# Patient Record
Sex: Female | Born: 1975
Health system: Southern US, Community
[De-identification: ages and names within clinical notes are randomized; demographics above are authoritative.]

## PROBLEM LIST (undated history)

## (undated) DIAGNOSIS — Z923 Personal history of irradiation: Secondary | ICD-10-CM

## (undated) DIAGNOSIS — G43909 Migraine, unspecified, not intractable, without status migrainosus: Secondary | ICD-10-CM

## (undated) DIAGNOSIS — K2970 Gastritis, unspecified, without bleeding: Secondary | ICD-10-CM

## (undated) DIAGNOSIS — K219 Gastro-esophageal reflux disease without esophagitis: Secondary | ICD-10-CM

## (undated) DIAGNOSIS — J45909 Unspecified asthma, uncomplicated: Secondary | ICD-10-CM

## (undated) HISTORY — PX: BREAST LUMPECTOMY: SHX2

## (undated) HISTORY — PX: PARTIAL THYMECTOMY: SHX2177

## (undated) HISTORY — PX: THYROIDECTOMY: SHX17

---

## 2001-05-24 ENCOUNTER — Encounter: Admission: RE | Admit: 2001-05-24 | Discharge: 2001-05-24 | Payer: Self-pay | Admitting: Neurosurgery

## 2001-05-24 ENCOUNTER — Encounter: Payer: Self-pay | Admitting: Neurosurgery

## 2004-04-23 ENCOUNTER — Emergency Department: Payer: Self-pay | Admitting: General Practice

## 2004-07-17 ENCOUNTER — Emergency Department: Payer: Self-pay | Admitting: Emergency Medicine

## 2005-02-16 ENCOUNTER — Observation Stay: Payer: Self-pay | Admitting: Obstetrics and Gynecology

## 2005-02-28 ENCOUNTER — Observation Stay: Payer: Self-pay | Admitting: Obstetrics and Gynecology

## 2005-03-06 ENCOUNTER — Inpatient Hospital Stay: Payer: Self-pay | Admitting: Obstetrics and Gynecology

## 2007-09-30 ENCOUNTER — Encounter: Payer: Self-pay | Admitting: Maternal and Fetal Medicine

## 2007-11-07 ENCOUNTER — Ambulatory Visit: Payer: Self-pay | Admitting: Obstetrics and Gynecology

## 2008-01-13 ENCOUNTER — Encounter: Payer: Self-pay | Admitting: Obstetrics & Gynecology

## 2008-01-14 ENCOUNTER — Encounter: Payer: Self-pay | Admitting: Obstetrics & Gynecology

## 2008-02-05 ENCOUNTER — Observation Stay: Payer: Self-pay | Admitting: Obstetrics and Gynecology

## 2008-02-10 ENCOUNTER — Encounter: Payer: Self-pay | Admitting: Obstetrics and Gynecology

## 2008-02-21 ENCOUNTER — Observation Stay: Payer: Self-pay | Admitting: Obstetrics and Gynecology

## 2008-02-25 ENCOUNTER — Observation Stay: Payer: Self-pay | Admitting: Obstetrics and Gynecology

## 2008-03-04 ENCOUNTER — Observation Stay: Payer: Self-pay | Admitting: Obstetrics and Gynecology

## 2008-03-09 ENCOUNTER — Observation Stay: Payer: Self-pay | Admitting: Obstetrics and Gynecology

## 2008-03-12 ENCOUNTER — Observation Stay: Payer: Self-pay | Admitting: Obstetrics and Gynecology

## 2008-03-16 ENCOUNTER — Encounter: Payer: Self-pay | Admitting: Maternal & Fetal Medicine

## 2008-03-23 ENCOUNTER — Observation Stay: Payer: Self-pay | Admitting: Obstetrics and Gynecology

## 2008-03-26 ENCOUNTER — Ambulatory Visit: Payer: Self-pay | Admitting: Obstetrics and Gynecology

## 2008-03-27 ENCOUNTER — Inpatient Hospital Stay: Payer: Self-pay | Admitting: Obstetrics and Gynecology

## 2009-12-02 ENCOUNTER — Ambulatory Visit: Payer: Self-pay | Admitting: Allergy

## 2010-03-10 ENCOUNTER — Ambulatory Visit: Payer: Self-pay | Admitting: Otolaryngology

## 2010-03-17 ENCOUNTER — Ambulatory Visit: Payer: Self-pay | Admitting: Otolaryngology

## 2010-03-28 LAB — PATHOLOGY REPORT

## 2010-06-14 ENCOUNTER — Ambulatory Visit: Payer: Self-pay | Admitting: Neurology

## 2010-06-15 ENCOUNTER — Ambulatory Visit: Payer: Self-pay | Admitting: Neurology

## 2011-03-16 DIAGNOSIS — E079 Disorder of thyroid, unspecified: Secondary | ICD-10-CM | POA: Insufficient documentation

## 2012-10-05 ENCOUNTER — Emergency Department: Payer: Self-pay | Admitting: Emergency Medicine

## 2012-10-16 ENCOUNTER — Emergency Department: Payer: Self-pay | Admitting: Internal Medicine

## 2012-11-22 ENCOUNTER — Emergency Department: Payer: Self-pay | Admitting: Emergency Medicine

## 2013-03-26 ENCOUNTER — Emergency Department: Payer: Self-pay | Admitting: Emergency Medicine

## 2013-03-26 LAB — CBC
HCT: 39 % (ref 35.0–47.0)
HGB: 12.7 g/dL (ref 12.0–16.0)
MCH: 27 pg (ref 26.0–34.0)
MCHC: 32.6 g/dL (ref 32.0–36.0)
MCV: 83 fL (ref 80–100)
Platelet: 270 10*3/uL (ref 150–440)
RBC: 4.71 10*6/uL (ref 3.80–5.20)
RDW: 13.4 % (ref 11.5–14.5)
WBC: 10.2 10*3/uL (ref 3.6–11.0)

## 2013-03-26 LAB — COMPREHENSIVE METABOLIC PANEL
Albumin: 3.5 g/dL (ref 3.4–5.0)
Alkaline Phosphatase: 79 U/L
Anion Gap: 8 (ref 7–16)
BUN: 12 mg/dL (ref 7–18)
Bilirubin,Total: 0.5 mg/dL (ref 0.2–1.0)
Calcium, Total: 8.6 mg/dL (ref 8.5–10.1)
Chloride: 104 mmol/L (ref 98–107)
Co2: 27 mmol/L (ref 21–32)
Creatinine: 0.9 mg/dL (ref 0.60–1.30)
EGFR (African American): >60
EGFR (Non-African Amer.): >60
Glucose: 110 mg/dL — ABNORMAL HIGH (ref 65–99)
Osmolality: 278 (ref 275–301)
Potassium: 3.5 mmol/L (ref 3.5–5.1)
SGOT(AST): 16 U/L (ref 15–37)
SGPT (ALT): 28 U/L (ref 12–78)
Sodium: 139 mmol/L (ref 136–145)
Total Protein: 7.4 g/dL (ref 6.4–8.2)

## 2013-03-26 LAB — URINALYSIS, COMPLETE
Bilirubin,UR: NEGATIVE
Blood: NEGATIVE
Glucose,UR: NEGATIVE mg/dL (ref 0–75)
Ketone: NEGATIVE
Leukocyte Esterase: NEGATIVE
Nitrite: NEGATIVE
Ph: 5 (ref 4.5–8.0)
Protein: 30
RBC,UR: 1 /HPF (ref 0–5)
Specific Gravity: 1.021 (ref 1.003–1.030)
Squamous Epithelial: 3
WBC UR: 5 /HPF (ref 0–5)

## 2013-03-26 LAB — PREGNANCY, URINE: Pregnancy Test, Urine: NEGATIVE m[IU]/mL

## 2013-03-28 ENCOUNTER — Ambulatory Visit: Payer: Self-pay | Admitting: Gastroenterology

## 2013-04-01 LAB — PATHOLOGY REPORT

## 2013-09-15 ENCOUNTER — Encounter (HOSPITAL_COMMUNITY): Payer: Self-pay | Admitting: Emergency Medicine

## 2013-09-15 ENCOUNTER — Emergency Department (HOSPITAL_COMMUNITY)
Admission: EM | Admit: 2013-09-15 | Discharge: 2013-09-15 | Disposition: A | Discharge status: Home / Self Care | Payer: 59 | Attending: Emergency Medicine | Admitting: Emergency Medicine

## 2013-09-15 ENCOUNTER — Emergency Department (HOSPITAL_COMMUNITY): Payer: 59

## 2013-09-15 DIAGNOSIS — K219 Gastro-esophageal reflux disease without esophagitis: Secondary | ICD-10-CM | POA: Diagnosis not present

## 2013-09-15 DIAGNOSIS — Z79899 Other long term (current) drug therapy: Secondary | ICD-10-CM | POA: Insufficient documentation

## 2013-09-15 DIAGNOSIS — Z91040 Latex allergy status: Secondary | ICD-10-CM | POA: Diagnosis not present

## 2013-09-15 DIAGNOSIS — R1310 Dysphagia, unspecified: Secondary | ICD-10-CM | POA: Insufficient documentation

## 2013-09-15 DIAGNOSIS — R4702 Dysphasia: Secondary | ICD-10-CM

## 2013-09-15 DIAGNOSIS — J45909 Unspecified asthma, uncomplicated: Secondary | ICD-10-CM | POA: Diagnosis not present

## 2013-09-15 DIAGNOSIS — G43909 Migraine, unspecified, not intractable, without status migrainosus: Secondary | ICD-10-CM | POA: Insufficient documentation

## 2013-09-15 DIAGNOSIS — K2970 Gastritis, unspecified, without bleeding: Secondary | ICD-10-CM | POA: Diagnosis not present

## 2013-09-15 DIAGNOSIS — Z9889 Other specified postprocedural states: Secondary | ICD-10-CM | POA: Insufficient documentation

## 2013-09-15 DIAGNOSIS — K2990 Gastroduodenitis, unspecified, without bleeding: Secondary | ICD-10-CM

## 2013-09-15 HISTORY — DX: Migraine, unspecified, not intractable, without status migrainosus: G43.909

## 2013-09-15 HISTORY — DX: Gastro-esophageal reflux disease without esophagitis: K21.9

## 2013-09-15 HISTORY — DX: Gastritis, unspecified, without bleeding: K29.70

## 2013-09-15 HISTORY — DX: Unspecified asthma, uncomplicated: J45.909

## 2013-09-15 MED ORDER — IBUPROFEN 100 MG/5ML PO SUSP
600.0000 mg | Freq: Once | ORAL | Status: AC
  Administered 2013-09-15: 600 mg via ORAL
  Filled 2013-09-15: qty 30

## 2013-09-15 NOTE — Discharge Instructions (Signed)
Your ultrasound is normal  You r left thyroid lobe is normal and there has been no regrowth of the right lobe.  You do have a slightly enlarged lymph node

## 2013-09-15 NOTE — ED Provider Notes (Signed)
CSN: [id number]     Arrival date & time 09/15/13  1809 History   First MD Initiated Contact with Patient 09/15/13 2043     Chief Complaint  Patient presents with  . Dysphagia     (Consider location/radiation/quality/duration/timing/severity/associated sxs/prior Treatment) HPI Comments: Patient reports dysphagia for several months was seen at [facility] and sent to cardiology--negative workup, than to GI who preformed an upper endoscopy which revealed gastric ulcers and esophagitis given Carafate which was helping but still feels as thought there is something in her throat that causes discomfort with swallowing.  She does have a history of a beginin thyroid nodule that was removed in 2011 or 2013   Has a appointment with ENT in 2-3 weeks but can't wait that long/  The history is provided by the patient.    Past Medical History  Diagnosis Date  . Gastritis   . GERD (gastroesophageal reflux disease)   . Asthma   . Migraine    Past Surgical History  Procedure Laterality Date  . Partial thymectomy    . Cesarean section     No family history on file. History  Substance Use Topics  . Smoking status: Never Smoker   . Smokeless tobacco: Not on file  . Alcohol Use: No   OB History   Grav Para Term Preterm Abortions TAB SAB Ect Mult Living                 Review of Systems  Constitutional: Negative for fever, activity change and appetite change.  HENT: Positive for trouble swallowing. Negative for dental problem, drooling and sore throat.   Skin: Negative for rash and wound.  Neurological: Negative for dizziness and weakness.  All other systems reviewed and are negative.     Allergies  Latex  Home Medications   Prior to Admission medications   Medication Sig Start Date End Date Taking? Authorizing Provider  ALPRAZolam Duanne Moron) 1 MG tablet Take 1 mg by mouth 2 (two) times daily as needed for anxiety.   Yes Historical Provider, MD  buPROPion (WELLBUTRIN XL) 300 MG 24  hr tablet Take 300 mg by mouth daily.   Yes Historical Provider, MD  eletriptan (RELPAX) 40 MG tablet Take 40 mg by mouth as needed for migraine or headache. One tablet by mouth at onset of headache. May repeat in 2 hours if headache persists or recurs.   Yes Historical Provider, MD  omeprazole (PRILOSEC) 20 MG capsule Take 20 mg by mouth 2 (two) times daily.   Yes Historical Provider, MD  promethazine (PHENERGAN) 12.5 MG tablet Take 12.5 mg by mouth every 6 (six) hours as needed for nausea or vomiting.   Yes Historical Provider, MD   BP 106/64  Pulse 96  Temp(Src) 98.6 F (37 C) (Oral)  Resp 23  SpO2 98%  LMP 09/08/2013 Physical Exam  Nursing note and vitals reviewed. Constitutional: She appears well-developed and well-nourished.  HENT:  Head: Normocephalic.  Eyes: Pupils are equal, round, and reactive to light.  Neck: Normal range of motion. No tracheal tenderness present. No tracheal deviation present. No thyromegaly present.      ED Course  Procedures (including critical care time) Labs Review Labs Reviewed - No data to display  Imaging Review US Soft Tissue Head/neck  09/15/2013   CLINICAL DATA:  Right neck nodule. Dysphagia. Right thyroidectomy 2011  EXAM: THYROID ULTRASOUND  TECHNIQUE: Ultrasound examination of the thyroid gland and adjacent soft tissues was performed.  COMPARISON:  None.  FINDINGS:  Right thyroid lobe  Surgically absent  Left thyroid lobe  Measurements: 4.2 x 1.6 x 1.5 cm.  No nodules visualized.  Isthmus  Thickness: 5 mm.  No nodules visualized.  Lymphadenopathy  None visualized.  IMPRESSION: Negative for mass in the neck post right thyroidectomy. Left thyroid lobe is normal.   Electronically Signed   By: Franchot Gallo M.D.   On: 09/15/2013 23:31     EKG Interpretation None      MDM  Ultrasound does not reveal the cause of this patients dysphagia but she has been reasured that there has ben no regrowth of her tumor Final diagnoses:  Dysphasia          Garald Balding, NP 09/15/13 2344

## 2013-09-15 NOTE — ED Notes (Addendum)
Pt presents for evaluation of difficulty swallowing for the past several months, reports for the last 3 weeks the feeling has gotten worse.  Pt was seen at [facility] ED 2 months ago and was dx with gastric ulcers and inflammation of esophagus-  given Carafate which she reports has been helping some but "still feels like something is stuck in my throat".  Pt has had partial thyroidectomy to the right side in the past and states "something doesn't feel right".  Pt speaking in full sentences in triage without difficulty, no shortness of breath or respiratory distress noted.

## 2013-09-16 NOTE — ED Provider Notes (Signed)
Medical screening examination/treatment/procedure(s) were performed by non-physician practitioner and as supervising physician I was immediately available for consultation/collaboration.   EKG Interpretation None       Ezequiel Essex, MD 09/16/13 1007

## 2013-09-26 ENCOUNTER — Ambulatory Visit: Payer: Self-pay | Admitting: Gastroenterology

## 2013-10-02 ENCOUNTER — Ambulatory Visit: Payer: Self-pay | Admitting: Gastroenterology

## 2013-10-28 DIAGNOSIS — R1310 Dysphagia, unspecified: Secondary | ICD-10-CM | POA: Insufficient documentation

## 2013-10-28 DIAGNOSIS — R1319 Other dysphagia: Secondary | ICD-10-CM | POA: Insufficient documentation

## 2013-10-28 DIAGNOSIS — K5909 Other constipation: Secondary | ICD-10-CM | POA: Insufficient documentation

## 2013-10-29 ENCOUNTER — Ambulatory Visit: Payer: Self-pay | Admitting: Gastroenterology

## 2013-11-11 ENCOUNTER — Encounter: Payer: Self-pay | Admitting: Gastroenterology

## 2013-11-25 ENCOUNTER — Ambulatory Visit: Payer: Self-pay | Admitting: Gastroenterology

## 2013-11-28 DIAGNOSIS — R0602 Shortness of breath: Secondary | ICD-10-CM | POA: Insufficient documentation

## 2013-11-28 DIAGNOSIS — R079 Chest pain, unspecified: Secondary | ICD-10-CM | POA: Insufficient documentation

## 2014-04-08 ENCOUNTER — Emergency Department: Payer: Self-pay | Admitting: Student

## 2014-05-01 ENCOUNTER — Ambulatory Visit: Payer: Self-pay | Admitting: Neurology

## 2015-10-04 ENCOUNTER — Other Ambulatory Visit: Payer: Self-pay | Admitting: Family Medicine

## 2015-10-04 ENCOUNTER — Ambulatory Visit
Admission: RE | Admit: 2015-10-04 | Discharge: 2015-10-04 | Disposition: A | Discharge status: Home / Self Care | Payer: 59 | Source: Ambulatory Visit | Attending: Family Medicine | Admitting: Family Medicine

## 2015-10-04 DIAGNOSIS — R109 Unspecified abdominal pain: Secondary | ICD-10-CM

## 2015-10-08 ENCOUNTER — Encounter: Payer: Self-pay | Admitting: Urology

## 2015-10-08 ENCOUNTER — Ambulatory Visit (INDEPENDENT_AMBULATORY_CARE_PROVIDER_SITE_OTHER): Payer: 59 | Admitting: Urology

## 2015-10-08 VITALS — BP 100/72 | HR 111 | Ht 63.0 in | Wt 231.8 lb

## 2015-10-08 DIAGNOSIS — R300 Dysuria: Secondary | ICD-10-CM

## 2015-10-08 DIAGNOSIS — N3020 Other chronic cystitis without hematuria: Secondary | ICD-10-CM | POA: Diagnosis not present

## 2015-10-08 DIAGNOSIS — F32A Depression, unspecified: Secondary | ICD-10-CM | POA: Insufficient documentation

## 2015-10-08 DIAGNOSIS — E669 Obesity, unspecified: Secondary | ICD-10-CM | POA: Insufficient documentation

## 2015-10-08 DIAGNOSIS — G43909 Migraine, unspecified, not intractable, without status migrainosus: Secondary | ICD-10-CM | POA: Insufficient documentation

## 2015-10-08 DIAGNOSIS — R3129 Other microscopic hematuria: Secondary | ICD-10-CM

## 2015-10-08 DIAGNOSIS — J45909 Unspecified asthma, uncomplicated: Secondary | ICD-10-CM | POA: Insufficient documentation

## 2015-10-08 DIAGNOSIS — E063 Autoimmune thyroiditis: Secondary | ICD-10-CM | POA: Insufficient documentation

## 2015-10-08 DIAGNOSIS — F419 Anxiety disorder, unspecified: Secondary | ICD-10-CM

## 2015-10-08 DIAGNOSIS — B019 Varicella without complication: Secondary | ICD-10-CM | POA: Insufficient documentation

## 2015-10-08 DIAGNOSIS — F329 Major depressive disorder, single episode, unspecified: Secondary | ICD-10-CM | POA: Insufficient documentation

## 2015-10-08 LAB — MICROSCOPIC EXAMINATION: WBC UA: NONE SEEN /HPF (ref 0–?)

## 2015-10-08 LAB — URINALYSIS, COMPLETE
BILIRUBIN UA: NEGATIVE
Glucose, UA: NEGATIVE
Ketones, UA: NEGATIVE
LEUKOCYTES UA: NEGATIVE
NITRITE UA: NEGATIVE
PH UA: 5.5 (ref 5.0–7.5)
Protein, UA: NEGATIVE
Specific Gravity, UA: <1.005 — ABNORMAL LOW (ref 1.005–1.030)
Urobilinogen, Ur: 0.2 mg/dL (ref 0.2–1.0)

## 2015-10-08 MED ORDER — TRIMETHOPRIM 100 MG PO TABS: 100.0000 mg | ORAL_TABLET | Freq: Every day | ORAL | 3 refills | Status: DC

## 2015-10-08 MED ORDER — TRIMETHOPRIM 100 MG PO TABS: 100.0000 mg | ORAL_TABLET | Freq: Every day | ORAL | 3 refills | Status: AC

## 2015-10-08 NOTE — Progress Notes (Signed)
10/08/2015 11:25 AM   Melissa Williamson 1975-11-11 [id number]  Referring provider: Briscoe Deutscher, DO [facility] [place], [place] [address]  Chief Complaint  Patient presents with  . Dysuria    New Patient   patient HPI: The patient describes recurrent urinary tract infections with burning, burning after urination, frequency and foul-smelling and cloudy urine that usually temporarily respond to antibiotic. She has them monthly  At baseline She voids every 1-2 hours and gets up once at night. He voids because of bladder fullness and generally feels empty and reports a good flow  He has pain with intercourse if she is having an infection.  She denies a history of kidney stones previous GU surgery and has not had a hysterectomy. She has no neurologic issues.  She recently went to the hospital and had a negative CT scan and negative culture. Her gynecologist does not send cultures based upon the history  Today she had microscopic hematuria on urinalysis  Modifying factors: There are no other modifying factors  Associated signs and symptoms: There are no other associated signs and symptoms Aggravating and relieving factors: There are no other aggravating or relieving factors Severity: Moderate Duration: Persistent       Past Medical History:  Diagnosis Date  . Asthma   . Gastritis   . GERD (gastroesophageal reflux disease)   . Migraine     Surgical History: Past Surgical History:  Procedure Laterality Date  . CESAREAN SECTION    . PARTIAL THYMECTOMY    . THYROIDECTOMY      Home Medications:    Medication List       Accurate as of 10/08/15 11:25 AM. Always use your most recent med list.          ALPRAZolam 1 MG tablet Commonly known as:  XANAX Take 1 mg by mouth 2 (two) times daily as needed for anxiety.   buPROPion 300 MG 24 hr tablet Commonly known as:  WELLBUTRIN XL Take 300 mg by mouth daily.   eletriptan 40 MG  tablet Commonly known as:  RELPAX Take 40 mg by mouth as needed for migraine or headache. One tablet by mouth at onset of headache. May repeat in 2 hours if headache persists or recurs.   omeprazole 20 MG capsule Commonly known as:  PRILOSEC Take 20 mg by mouth 2 (two) times daily.   promethazine 12.5 MG tablet Commonly known as:  PHENERGAN Take 12.5 mg by mouth every 6 (six) hours as needed for nausea or vomiting.   URO-MP 118 MG Caps Take by mouth 4 (four) times daily as needed.       Allergies:  Allergies  Allergen Reactions  . Codeine     Other reaction(s): Vomiting  . Latex     Other reaction(s): Other (See Comments) Rash  Rash     Family History: Family History  Problem Relation Age of Onset  . Bladder Cancer Maternal Grandmother     Social History:  reports that she has never smoked. She does not have any smokeless tobacco history on file. She reports that she does not drink alcohol. Her drug history is not on file.  ROS: UROLOGY Frequent Urination?: Yes Hard to postpone urination?: No Burning/pain with urination?: Yes Get up at night to urinate?: Yes Leakage of urine?: Yes Urine stream starts and stops?: No Trouble starting stream?: No Do you have to strain to urinate?: No Blood in urine?: Yes Urinary tract infection?: Yes Sexually  transmitted disease?: No Injury to kidneys or bladder?: No Painful intercourse?: No Weak stream?: No Currently pregnant?: No Vaginal bleeding?: No Last menstrual period?: No  Gastrointestinal Nausea?: No Vomiting?: No Indigestion/heartburn?: Yes Diarrhea?: No Constipation?: No  Constitutional Fever: No Night sweats?: No Weight loss?: No Fatigue?: Yes  Skin Skin rash/lesions?: No Itching?: No  Eyes Blurred vision?: No Double vision?: No  Ears/Nose/Throat Sore throat?: No Sinus problems?: No  Hematologic/Lymphatic Swollen glands?: No Easy bruising?: No  Cardiovascular Leg swelling?: No Chest  pain?: No  Respiratory Cough?: No Shortness of breath?: No  Endocrine Excessive thirst?: No  Musculoskeletal Back pain?: No Joint pain?: No  Neurological Headaches?: Yes Dizziness?: No  Psychologic Depression?: No Anxiety?: No  Physical Exam: BP 100/72   Pulse (!) 111   Ht 5\' 3"  (1.6 m)   Wt 231 lb 12.8 oz (105.1 kg)   BMI 41.06 kg/m   Constitutional:  Alert and oriented, No acute distress. HEENT: [place] AT, moist mucus membranes.  Trachea midline, no masses. Cardiovascular: No clubbing, cyanosis, or edema. Respiratory: Normal respiratory effort, no increased work of breathing. GI: Abdomen is soft, nontender, nondistended, no abdominal masses GU: Small grade 2 cystocele and a negative cough test Skin: No rashes, bruises or suspicious lesions. Lymph: No cervical or inguinal adenopathy. Neurologic: Grossly intact, no focal deficits, moving all 4 extremities. Psychiatric: Normal mood and affect.  Laboratory Data: Lab Results  Component Value Date   WBC 10.2 03/26/2013   HGB 12.7 03/26/2013   HCT 39.0 03/26/2013   MCV 83 03/26/2013   PLT 270 03/26/2013    Lab Results  Component Value Date   CREATININE 0.90 03/26/2013    No results found for: PSA  No results found for: TESTOSTERONE  No results found for: HGBA1C  Urinalysis    Component Value Date/Time   COLORURINE Yellow 03/26/2013 0158   APPEARANCEUR Hazy 03/26/2013 0158   LABSPEC 1.021 03/26/2013 0158   PHURINE 5.0 03/26/2013 0158   GLUCOSEU Negative 03/26/2013 0158   HGBUR Negative 03/26/2013 0158   BILIRUBINUR Negative 03/26/2013 0158   KETONESUR Negative 03/26/2013 0158   PROTEINUR 30 mg/dL 03/26/2013 0158   NITRITE Negative 03/26/2013 0158   LEUKOCYTESUR Negative 03/26/2013 0158    Pertinent Imaging: Normal CT scan  Assessment & Plan:  the patient likely has chronic cystitis. My index of suspicion is low that she has interstitial cystitis. Someone in the emergency room actually place her on  Elmiron. She never filled the prescription Over the counter medications ashy help some of her symptoms.  At baseline she has mild nocturia and moderate frequency.  I have recommended a cystoscopy for the microscopic hematuria. The pathophysiology of chronic cystitis was discussed. The role of prophylaxis was discussed. The role of getting cultures was discussed. If she has flares with negative cultures I would recommend a bladder hydrodistention  I will see the patient in 2 months on daily trimethoprim. 90 tablets and 3 refills sent. I will perform cystoscopy on that day   1. Dysuria 2. Chronic cystitis 3. Microscopic hematuria    Urinalysis, Complete   No Follow-up on file.  Reece Packer, MD  [facility] [address] [place], [place] [address] [phone]

## 2015-10-26 ENCOUNTER — Ambulatory Visit: Payer: Self-pay | Admitting: Urology

## 2015-12-27 ENCOUNTER — Ambulatory Visit (INDEPENDENT_AMBULATORY_CARE_PROVIDER_SITE_OTHER): Payer: 59 | Admitting: Urology

## 2015-12-27 VITALS — BP 107/76 | HR 106 | Ht 63.0 in | Wt 231.6 lb

## 2015-12-27 DIAGNOSIS — N3020 Other chronic cystitis without hematuria: Secondary | ICD-10-CM | POA: Diagnosis not present

## 2015-12-27 DIAGNOSIS — R300 Dysuria: Secondary | ICD-10-CM

## 2015-12-27 MED ORDER — LIDOCAINE HCL 2 % EX GEL
1.0000 "application " | Freq: Once | CUTANEOUS | Status: AC
  Administered 2015-12-27: 1 via URETHRAL

## 2015-12-27 MED ORDER — CIPROFLOXACIN HCL 500 MG PO TABS
500.0000 mg | ORAL_TABLET | Freq: Once | ORAL | Status: AC
  Administered 2015-12-27: 500 mg via ORAL

## 2015-12-27 MED ORDER — CIPROFLOXACIN HCL 500 MG PO TABS: 500.0000 mg | ORAL_TABLET | Freq: Once | ORAL | Status: DC

## 2015-12-27 MED ORDER — LIDOCAINE HCL 2 % EX GEL
1.0000 | Freq: Once | CUTANEOUS | Status: DC
Start: 2015-12-27 — End: 2015-12-27

## 2015-12-27 NOTE — Progress Notes (Signed)
12/27/2015 4:16 PM   Melissa Williamson September 21, 1975 [id number]  Referring provider: No referring provider defined for this encounter.  Chief Complaint  Patient presents with  . Cysto    dysuria    HPI: The patient describes recurrent urinary tract infections with burning, burning after urination, frequency and foul-smelling and cloudy urine that usually temporarily respond to antibiotic. She has them monthly  At baseline She voids every 1-2 hours and gets up once at night. He voids because of bladder fullness and generally feels empty and reports a good flow  He has pain with intercourse if she is having an infection.  She recently went to the hospital and had a negative CT scan and negative culture. Her gynecologist does not send cultures based upon the history    Small grade 2 cystocele and a negative cough test  the patient likely has chronic cystitis. My index of suspicion is low that she has interstitial cystitis. Someone in the emergency room actually place her on Elmiron. She never filled the prescription Over the counter medications ashy help some of her symptoms.  At baseline she has mild nocturia and moderate frequency.  I have recommended a cystoscopy for the microscopic hematuria. The pathophysiology of chronic cystitis was discussed. The role of prophylaxis was discussed. The role of getting cultures was discussed. If she has flares with negative cultures I would recommend a bladder hydrodistention  Today Frequency is stable It took a while but now on daily trimethoprim she is doing well. Clinically she is not infected today  Cystoscopy Flexible cystoscopy utilizing sterile technique after consent was performed. It took a few moments to fill her bladder. Overall she tolerated the procedure very well. The bladder mucosa and trigone were normal. There was no stitch or foreign body or carcinoma. There was no cystitis.     PMH: Past Medical History:  Diagnosis  Date  . Asthma   . Gastritis   . GERD (gastroesophageal reflux disease)   . Migraine     Surgical History: Past Surgical History:  Procedure Laterality Date  . CESAREAN SECTION    . PARTIAL THYMECTOMY    . THYROIDECTOMY      Home Medications:    Medication List       Accurate as of 12/27/15  4:16 PM. Always use your most recent med list.          ALPRAZolam 1 MG tablet Commonly known as:  XANAX Take 1 mg by mouth 2 (two) times daily as needed for anxiety.   buPROPion 300 MG 24 hr tablet Commonly known as:  WELLBUTRIN XL Take 300 mg by mouth daily.   eletriptan 40 MG tablet Commonly known as:  RELPAX Take 40 mg by mouth as needed for migraine or headache. One tablet by mouth at onset of headache. May repeat in 2 hours if headache persists or recurs.   omeprazole 20 MG capsule Commonly known as:  PRILOSEC Take 20 mg by mouth 2 (two) times daily.   promethazine 12.5 MG tablet Commonly known as:  PHENERGAN Take 12.5 mg by mouth every 6 (six) hours as needed for nausea or vomiting.   trimethoprim 100 MG tablet Commonly known as:  TRIMPEX Take 1 tablet (100 mg total) by mouth daily.   URO-MP 118 MG Caps Take by mouth 4 (four) times daily as needed.       Allergies:  Allergies  Allergen Reactions  . Codeine     Other reaction(s): Vomiting  . Latex  Other reaction(s): Other (See Comments) Rash  Rash     Family History: Family History  Problem Relation Age of Onset  . Bladder Cancer Maternal Grandmother     Social History:  reports that she has never smoked. She does not have any smokeless tobacco history on file. She reports that she does not drink alcohol. Her drug history is not on file.  ROS:                                        Physical Exam: BP 107/76   Pulse (!) 106   Ht 5\' 3"  (1.6 m)   Wt 231 lb 9.6 oz (105.1 kg)   BMI 41.03 kg/m     Laboratory Data: Lab Results  Component Value Date   WBC 10.2  03/26/2013   HGB 12.7 03/26/2013   HCT 39.0 03/26/2013   MCV 83 03/26/2013   PLT 270 03/26/2013    Lab Results  Component Value Date   CREATININE 0.90 03/26/2013    No results found for: PSA  No results found for: TESTOSTERONE  No results found for: HGBA1C  Urinalysis    Component Value Date/Time   COLORURINE Yellow 03/26/2013 0158   APPEARANCEUR Hazy (A) 10/08/2015 1057   LABSPEC 1.021 03/26/2013 0158   PHURINE 5.0 03/26/2013 0158   GLUCOSEU Negative 10/08/2015 1057   GLUCOSEU Negative 03/26/2013 0158   HGBUR Negative 03/26/2013 0158   BILIRUBINUR Negative 10/08/2015 1057   BILIRUBINUR Negative 03/26/2013 0158   KETONESUR Negative 03/26/2013 0158   PROTEINUR Negative 10/08/2015 1057   PROTEINUR 30 mg/dL 03/26/2013 0158   NITRITE Negative 10/08/2015 1057   NITRITE Negative 03/26/2013 0158   LEUKOCYTESUR Negative 10/08/2015 1057   LEUKOCYTESUR Negative 03/26/2013 0158    Pertinent Imaging: none  Assessment & Plan:  The patient has chronic cystitis on daily trimethoprim. I will get urine cultures in the future. I would like to reassess her in 4 months  1. Dysuria  - Urinalysis, Complete - ciprofloxacin (CIPRO) tablet 500 mg; Take 1 tablet (500 mg total) by mouth once. - lidocaine (XYLOCAINE) 2 % jelly 1 application; Place 1 application into the urethra once.  2. Chronic cystitis  - Urinalysis, Complete - ciprofloxacin (CIPRO) tablet 500 mg; Take 1 tablet (500 mg total) by mouth once. - lidocaine (XYLOCAINE) 2 % jelly 1 application; Place 1 application into the urethra once.   No Follow-up on file.  Reece Packer, MD  [facility] [address] [place],  [address] [phone]

## 2016-04-25 ENCOUNTER — Ambulatory Visit: Payer: 59

## 2016-04-25 ENCOUNTER — Ambulatory Visit: Payer: 59 | Admitting: Urology

## 2017-01-16 ENCOUNTER — Ambulatory Visit: Payer: 59 | Attending: Neurology

## 2017-01-16 ENCOUNTER — Other Ambulatory Visit: Payer: Self-pay | Admitting: Neurology

## 2017-01-16 DIAGNOSIS — G4761 Periodic limb movement disorder: Secondary | ICD-10-CM | POA: Insufficient documentation

## 2017-01-16 DIAGNOSIS — R51 Headache: Principal | ICD-10-CM

## 2017-01-16 DIAGNOSIS — R400 Somnolence: Secondary | ICD-10-CM | POA: Diagnosis not present

## 2017-01-16 DIAGNOSIS — R0683 Snoring: Secondary | ICD-10-CM | POA: Insufficient documentation

## 2017-01-16 DIAGNOSIS — R519 Headache, unspecified: Secondary | ICD-10-CM

## 2017-01-22 ENCOUNTER — Ambulatory Visit: Admission: RE | Admit: 2017-01-22 | Payer: 59 | Source: Ambulatory Visit

## 2017-01-30 ENCOUNTER — Ambulatory Visit
Admission: RE | Admit: 2017-01-30 | Discharge: 2017-01-30 | Disposition: A | Discharge status: Home / Self Care | Payer: 59 | Source: Ambulatory Visit | Attending: Neurology | Admitting: Neurology

## 2017-01-30 DIAGNOSIS — R519 Headache, unspecified: Secondary | ICD-10-CM

## 2017-01-30 DIAGNOSIS — R51 Headache: Principal | ICD-10-CM

## 2017-01-31 ENCOUNTER — Ambulatory Visit
Admission: RE | Admit: 2017-01-31 | Discharge: 2017-01-31 | Disposition: A | Discharge status: Home / Self Care | Payer: 59 | Source: Ambulatory Visit | Attending: Neurology | Admitting: Neurology

## 2017-01-31 DIAGNOSIS — R51 Headache: Secondary | ICD-10-CM | POA: Diagnosis present

## 2017-01-31 MED ORDER — GADOBENATE DIMEGLUMINE 529 MG/ML IV SOLN
20.0000 mL | Freq: Once | INTRAVENOUS | Status: AC | PRN
  Administered 2017-01-31: 20 mL via INTRAVENOUS

## 2017-02-14 ENCOUNTER — Ambulatory Visit: Payer: 59

## 2017-03-21 DIAGNOSIS — G43719 Chronic migraine without aura, intractable, without status migrainosus: Secondary | ICD-10-CM | POA: Diagnosis not present

## 2017-03-21 DIAGNOSIS — M542 Cervicalgia: Secondary | ICD-10-CM | POA: Diagnosis not present

## 2017-04-27 DIAGNOSIS — Z820 Family history of epilepsy and other diseases of the nervous system: Secondary | ICD-10-CM | POA: Diagnosis not present

## 2017-10-25 DIAGNOSIS — Z01419 Encounter for gynecological examination (general) (routine) without abnormal findings: Secondary | ICD-10-CM | POA: Diagnosis not present

## 2017-11-23 DIAGNOSIS — M9901 Segmental and somatic dysfunction of cervical region: Secondary | ICD-10-CM | POA: Diagnosis not present

## 2017-11-23 DIAGNOSIS — M50323 Other cervical disc degeneration at C6-C7 level: Secondary | ICD-10-CM | POA: Diagnosis not present

## 2017-11-23 DIAGNOSIS — M4602 Spinal enthesopathy, cervical region: Secondary | ICD-10-CM | POA: Diagnosis not present

## 2017-11-23 DIAGNOSIS — Z1231 Encounter for screening mammogram for malignant neoplasm of breast: Secondary | ICD-10-CM | POA: Diagnosis not present

## 2017-12-27 IMAGING — MR MR HEAD WO/W CM
10 series · 48 of 48 positions shown · IV contrast (20 ML MULTIHANCE)
Comparison: 05/01/2014

CLINICAL DATA: Temporal migraines. Neck pain for a few years.
Nausea and vertigo.

EXAM:
MRI HEAD WITHOUT AND WITH CONTRAST
TECHNIQUE: Multiplanar, multiecho pulse sequences of the brain and surrounding
structures were obtained without and with intravenous contrast.
CONTRAST:  20mL MULTIHANCE GADOBENATE DIMEGLUMINE 529 MG/ML IV SOLN

[Series 3: DWI · axial · 3.0mm · 1.20mm/px · z∈[-67,+95]mm · 4 of 55 slices shown (1 of 2)]
[im 1/55]
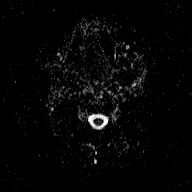
[im 19/55]
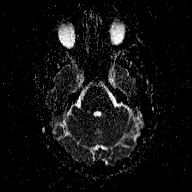
[im 37/55]
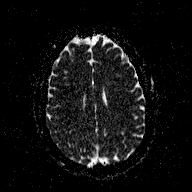
[im 55/55]
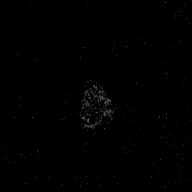

[Series 4: T1 · sagittal · 5.0mm · 0.45mm/px · 2 of 23 slices shown (1 of 2)]
[im 1/23]
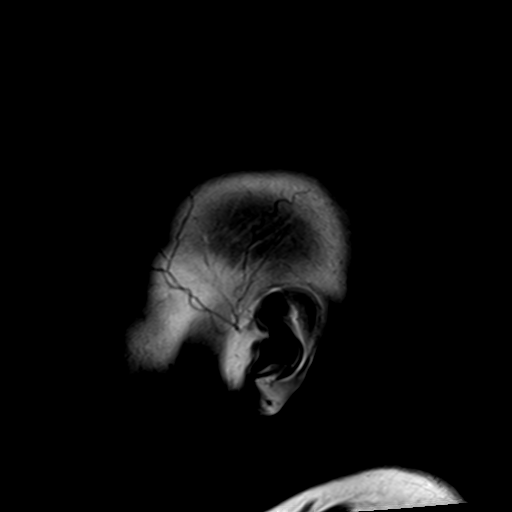
[im 23/23]
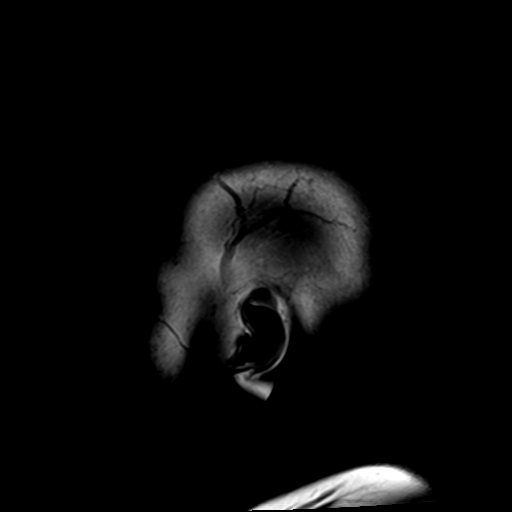

[Series 5: T2 · axial · 5.0mm · 0.72mm/px · z∈[-66,+95]mm · 2 of 24 slices shown (1 of 2)]
[im 1/24]
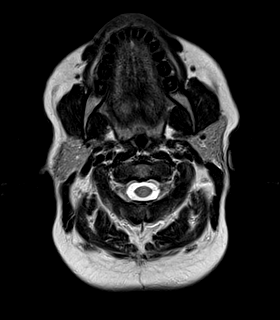
[im 24/24]
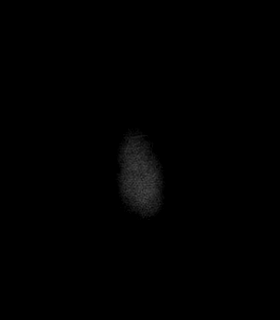

[Series 6: FLAIR · axial · 3.0mm · 0.45mm/px · z∈[-67,+95]mm · 4 of 55 slices shown]
[im 1/55]
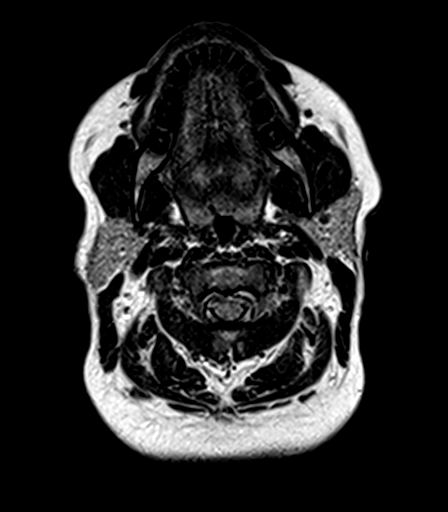
[im 19/55]
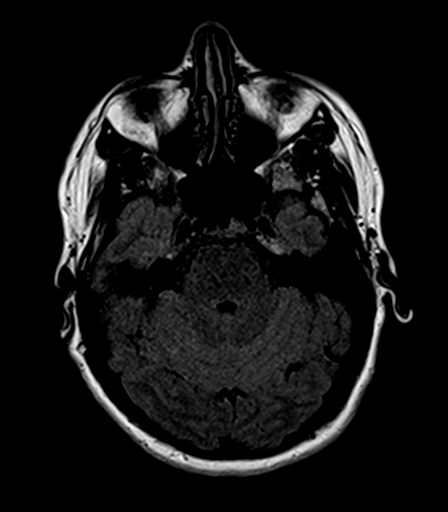
[im 37/55]
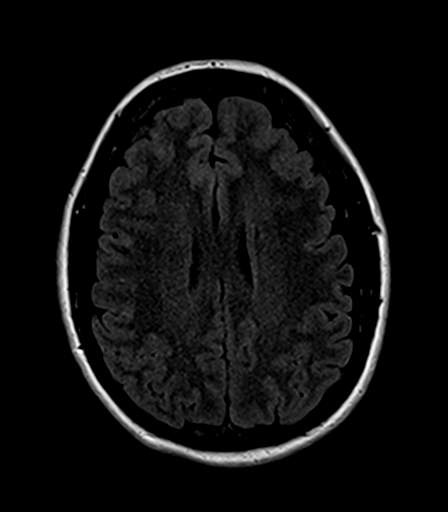
[im 55/55]
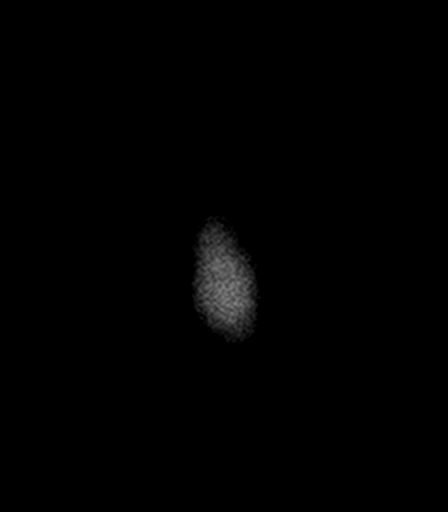

[Series 7: T2 · axial · 5.0mm · 0.72mm/px · z∈[-66,+95]mm · 2 of 24 slices shown (2 of 2)]
[im 1/24]
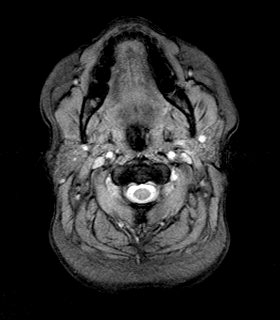
[im 24/24]
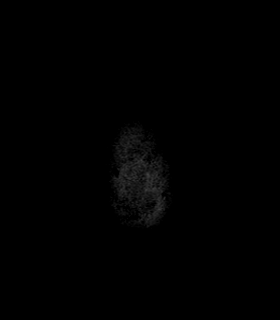

[Series 8: T1 · axial · 1.0mm · 1.00mm/px · z∈[-64,+94]mm · 13 of 160 slices shown (2 of 2)]
[im 1/160]
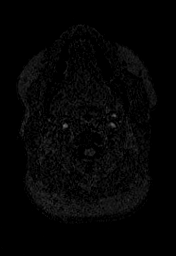
[im 14/160]
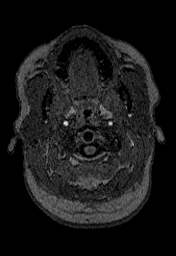
[im 27/160]
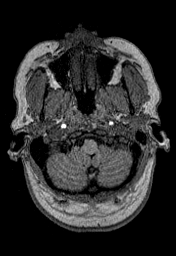
[im 40/160]
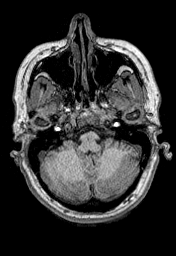
[im 54/160]
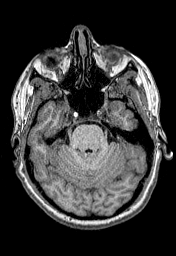
[im 67/160]
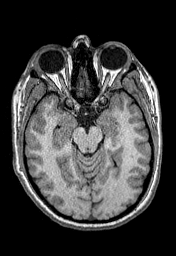
[im 80/160]
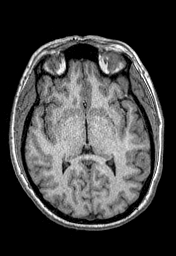
[im 93/160]
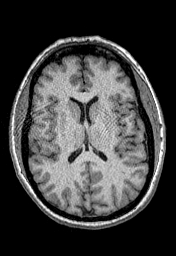
[im 107/160]
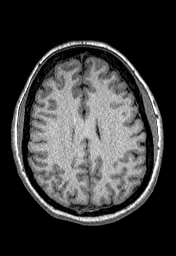
[im 120/160]
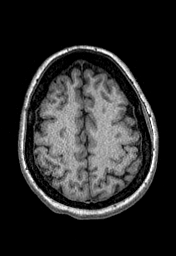
[im 133/160]
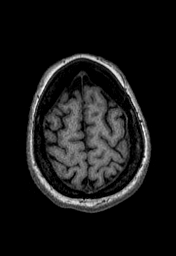
[im 146/160]
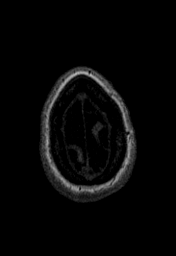
[im 160/160]
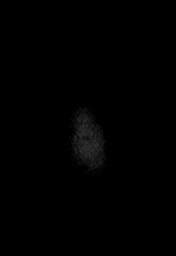

[Series 9: T2 post-contrast · coronal · 5.0mm · 0.45mm/px · 2 of 29 slices shown]
[im 1/29]
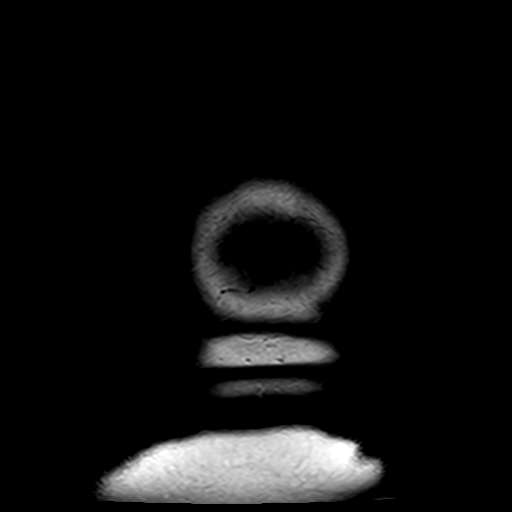
[im 29/29]
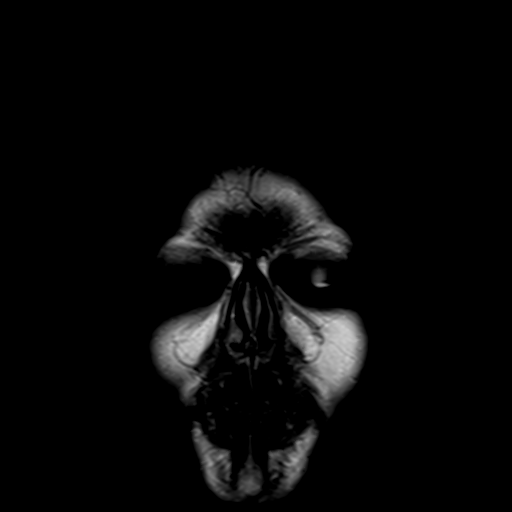

[Series 10: T1 post-contrast · axial · 1.0mm · 1.00mm/px · z∈[-64,+94]mm · 13 of 160 slices shown (1 of 2)]
[im 1/160]
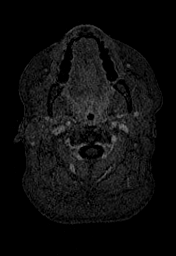
[im 14/160]
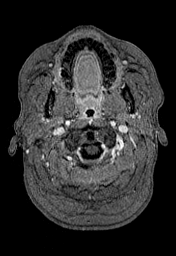
[im 27/160]
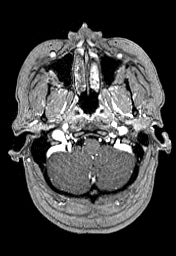
[im 40/160]
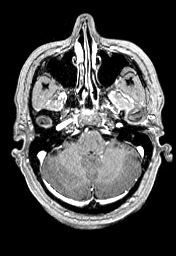
[im 54/160]
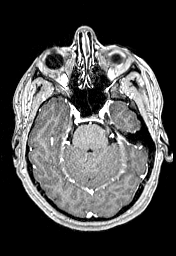
[im 67/160]
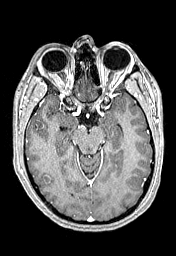
[im 80/160]
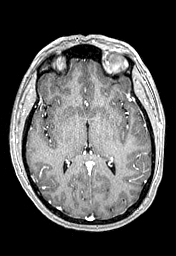
[im 93/160]
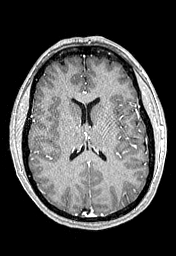
[im 107/160]
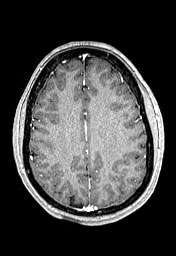
[im 120/160]
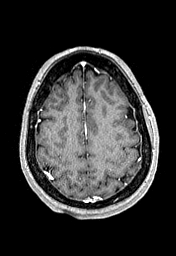
[im 133/160]
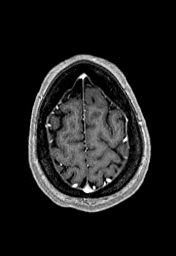
[im 146/160]
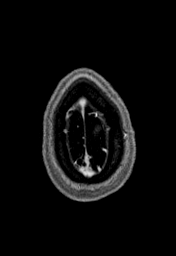
[im 160/160]
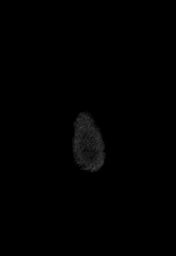

[Series 11: T1 post-contrast · coronal · 5.0mm · 0.45mm/px · 2 of 29 slices shown (2 of 2)]
[im 1/29]
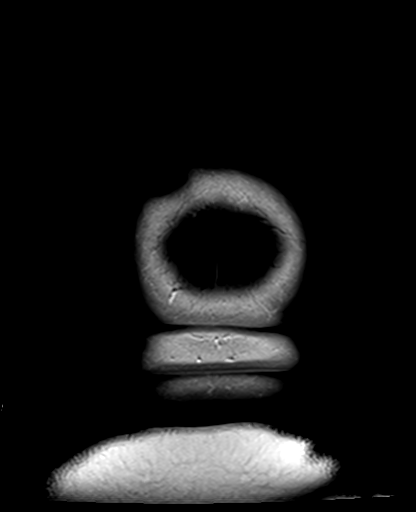
[im 29/29]
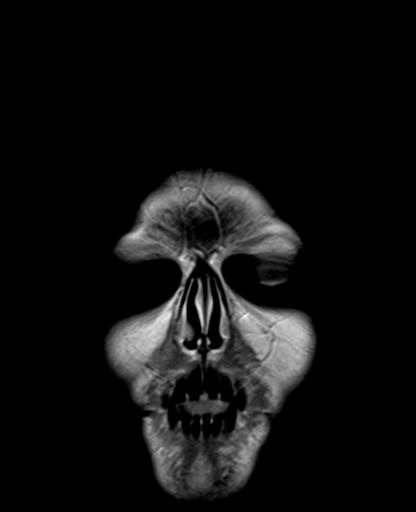

[Series 100: DWI · axial · 3.0mm · 1.20mm/px · z∈[-67,+95]mm · 4 of 55 slices shown (2 of 2)]
[im 1/55]
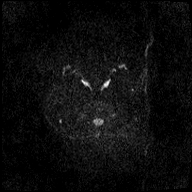
[im 19/55]
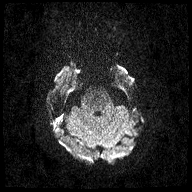
[im 37/55]
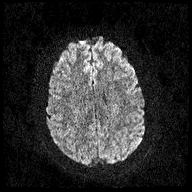
[im 55/55]
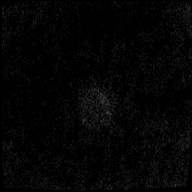

[48 of 48 positions shown; findings below may reference images not displayed]

FINDINGS: Brain: No infarction, hemorrhage, hydrocephalus, extra-axial
collection or mass lesion. No white matter disease or atrophy. No
abnormal intracranial enhancement.

Vascular: Major flow voids and vascular enhancements are preserved.

Skull and upper cervical spine: Negative for marrow lesion

Sinuses/Orbits: Negative
IMPRESSION: Normal brain MRI.

## 2018-01-23 DIAGNOSIS — R928 Other abnormal and inconclusive findings on diagnostic imaging of breast: Secondary | ICD-10-CM | POA: Diagnosis not present

## 2018-01-24 DIAGNOSIS — C50112 Malignant neoplasm of central portion of left female breast: Secondary | ICD-10-CM | POA: Diagnosis not present

## 2018-01-24 DIAGNOSIS — Z885 Allergy status to narcotic agent status: Secondary | ICD-10-CM | POA: Diagnosis not present

## 2018-01-24 DIAGNOSIS — R928 Other abnormal and inconclusive findings on diagnostic imaging of breast: Secondary | ICD-10-CM | POA: Diagnosis not present

## 2018-01-24 DIAGNOSIS — N6323 Unspecified lump in the left breast, lower outer quadrant: Secondary | ICD-10-CM | POA: Diagnosis not present

## 2018-01-24 DIAGNOSIS — Z6841 Body Mass Index (BMI) 40.0 and over, adult: Secondary | ICD-10-CM | POA: Diagnosis not present

## 2018-01-31 DIAGNOSIS — J40 Bronchitis, not specified as acute or chronic: Secondary | ICD-10-CM | POA: Diagnosis not present

## 2018-01-31 DIAGNOSIS — J069 Acute upper respiratory infection, unspecified: Secondary | ICD-10-CM | POA: Diagnosis not present

## 2018-02-01 DIAGNOSIS — Z6841 Body Mass Index (BMI) 40.0 and over, adult: Secondary | ICD-10-CM | POA: Diagnosis not present

## 2018-02-01 DIAGNOSIS — Z87891 Personal history of nicotine dependence: Secondary | ICD-10-CM | POA: Diagnosis not present

## 2018-02-01 DIAGNOSIS — Z170 Estrogen receptor positive status [ER+]: Secondary | ICD-10-CM | POA: Diagnosis not present

## 2018-02-01 DIAGNOSIS — C50912 Malignant neoplasm of unspecified site of left female breast: Secondary | ICD-10-CM | POA: Diagnosis not present

## 2018-02-08 DIAGNOSIS — Z170 Estrogen receptor positive status [ER+]: Secondary | ICD-10-CM | POA: Diagnosis not present

## 2018-02-08 DIAGNOSIS — C50912 Malignant neoplasm of unspecified site of left female breast: Secondary | ICD-10-CM | POA: Diagnosis not present

## 2018-02-08 DIAGNOSIS — C50919 Malignant neoplasm of unspecified site of unspecified female breast: Secondary | ICD-10-CM | POA: Diagnosis not present

## 2018-02-12 DIAGNOSIS — C50912 Malignant neoplasm of unspecified site of left female breast: Secondary | ICD-10-CM | POA: Diagnosis not present

## 2018-02-25 DIAGNOSIS — C50912 Malignant neoplasm of unspecified site of left female breast: Secondary | ICD-10-CM | POA: Diagnosis not present

## 2018-02-26 DIAGNOSIS — Z170 Estrogen receptor positive status [ER+]: Secondary | ICD-10-CM | POA: Diagnosis not present

## 2018-02-26 DIAGNOSIS — C50912 Malignant neoplasm of unspecified site of left female breast: Secondary | ICD-10-CM | POA: Diagnosis not present

## 2018-02-26 DIAGNOSIS — C50312 Malignant neoplasm of lower-inner quadrant of left female breast: Secondary | ICD-10-CM | POA: Diagnosis not present

## 2018-02-26 DIAGNOSIS — N6042 Mammary duct ectasia of left breast: Secondary | ICD-10-CM | POA: Diagnosis not present

## 2018-02-26 DIAGNOSIS — C50212 Malignant neoplasm of upper-inner quadrant of left female breast: Secondary | ICD-10-CM | POA: Diagnosis not present

## 2018-02-26 DIAGNOSIS — Z9889 Other specified postprocedural states: Secondary | ICD-10-CM | POA: Diagnosis not present

## 2018-04-09 ENCOUNTER — Ambulatory Visit: Payer: 59 | Admitting: Radiation Oncology

## 2018-04-17 ENCOUNTER — Ambulatory Visit
Admission: RE | Admit: 2018-04-17 | Discharge: 2018-04-17 | Disposition: A | Discharge status: Home / Self Care | Payer: 59 | Source: Ambulatory Visit | Attending: Radiation Oncology | Admitting: Radiation Oncology

## 2018-04-17 ENCOUNTER — Other Ambulatory Visit: Payer: Self-pay

## 2018-04-17 ENCOUNTER — Encounter (INDEPENDENT_AMBULATORY_CARE_PROVIDER_SITE_OTHER): Payer: Self-pay

## 2018-04-17 ENCOUNTER — Encounter: Payer: Self-pay | Admitting: Radiation Oncology

## 2018-04-17 ENCOUNTER — Other Ambulatory Visit: Payer: Self-pay | Admitting: *Deleted

## 2018-04-17 VITALS — BP 146/81 | HR 120 | Temp 97.5°F | Resp 16 | Wt 266.4 lb

## 2018-04-17 DIAGNOSIS — Z170 Estrogen receptor positive status [ER+]: Principal | ICD-10-CM

## 2018-04-17 DIAGNOSIS — C50112 Malignant neoplasm of central portion of left female breast: Secondary | ICD-10-CM

## 2018-04-17 MED ORDER — ONDANSETRON HCL 8 MG PO TABS: 8.0000 mg | ORAL_TABLET | Freq: Three times a day (TID) | ORAL | 0 refills | Status: DC | PRN

## 2018-04-17 NOTE — Consult Note (Signed)
NEW PATIENT EVALUATION  Name: Melissa Williamson  MRN: [id number]  Date:   04/17/2018     DOB: 09/18/1975   This 43 y.o. female patient presents to the clinic for initial evaluation of stage IA ER/PR positive invasive mammary carcinoma of the left breast status post wide local excision and sentinel node biopsy.  REFERRING PHYSICIAN: Ardell Isaacs, NP  CHIEF COMPLAINT:  Chief Complaint  Patient presents with  . Breast Cancer    Initial consultation of breast cancer     DIAGNOSIS: There were no encounter diagnoses.   PREVIOUS INVESTIGATIONS:  Mammograms ultrasound reviewed Clinical notes reviewed Pathology reports reviewed  HPI: patient is a 43 year old female who presented with an abnormal mammogram of her left breast.initial mammogram confirm I ultrasound showed a 0.6 cm irregular mass in the medial central left breast with spiculated margins highly suspicious of malignancy. She underwent tomographic guided biopsywhich was positive for invasive mammary carcinoma. She went on to have a wide local excision.Tumor was invasive mammary carcinoma 8 mm in greatest dimension. There is also iductal carcinoma in situ spanning approximately 2.7cm. Margins for invasive cancer were clear at greater than 2 mm ductal carcinoma margins also were clear at greater than tomorrow 2 mm.she underwent an Oncotype DX which a score of 13 considered low risk she would not receivechemotherapy. Patient will be candidate for antiestrogen therapy after completion of radiation. She is now referred to radiation oncology for consideration of treatment. She is doing well. She has been having bouts of nausea recently and has been referred to internal medicine for evaluation.  PLANNED TREATMENT REGIMEN: left breast whole breast radiation  PAST MEDICAL HISTORY:  has a past medical history of Asthma, Gastritis, GERD (gastroesophageal reflux disease), and Migraine.    PAST SURGICAL HISTORY:  Past Surgical History:   Procedure Laterality Date  . CESAREAN SECTION    . PARTIAL THYMECTOMY    . THYROIDECTOMY      FAMILY HISTORY: family history includes Bladder Cancer in her maternal grandmother.  SOCIAL HISTORY:  reports that she has never smoked. She does not have any smokeless tobacco history on file. She reports that she does not drink alcohol.  ALLERGIES: Codeine and Latex  MEDICATIONS:  Current Outpatient Medications  Medication Sig Dispense Refill  . albuterol (PROVENTIL HFA;VENTOLIN HFA) 108 (90 Base) MCG/ACT inhaler TAKE 2 PUFFS EVERY 4 HOURS AS NEEDED FOR COUGH OR WHEEZING    . DULoxetine (CYMBALTA) 20 MG capsule     . omeprazole (PRILOSEC) 20 MG capsule Take 20 mg by mouth 2 (two) times daily.    . promethazine (PHENERGAN) 12.5 MG tablet Take 12.5 mg by mouth every 6 (six) hours as needed for nausea or vomiting.    . sucralfate (CARAFATE) 1 g tablet     . SUMAtriptan (IMITREX) 100 MG tablet     . trimethoprim (TRIMPEX) 100 MG tablet Take 1 tablet (100 mg total) by mouth daily. 90 tablet 3  . zolpidem (AMBIEN) 10 MG tablet     . ondansetron (ZOFRAN) 8 MG tablet Take 1 tablet (8 mg total) by mouth every 8 (eight) hours as needed for nausea or vomiting. 30 tablet 0   No current facility-administered medications for this encounter.     ECOG PERFORMANCE STATUS:  0 - Asymptomatic  REVIEW OF SYSTEMS:  Patient denies any weight loss, fatigue, weakness, fever, chills or night sweats. Patient denies any loss of vision, blurred vision. Patient denies any ringing  of the ears or hearing loss. No  irregular heartbeat. Patient denies heart murmur or history of fainting. Patient denies any chest pain or pain radiating to her upper extremities. Patient denies any shortness of breath, difficulty breathing at night, cough or hemoptysis. Patient denies any swelling in the lower legs. Patient denies any nausea vomiting, vomiting of blood, or coffee ground material in the vomitus. Patient denies any stomach  pain. Patient states has had normal bowel movements no significant constipation or diarrhea. Patient denies any dysuria, hematuria or significant nocturia. Patient denies any problems walking, swelling in the joints or loss of balance. Patient denies any skin changes, loss of hair or loss of weight. Patient denies any excessive worrying or anxiety or significant depression. Patient denies any problems with insomnia. Patient denies excessive thirst, polyuria, polydipsia. Patient denies any swollen glands, patient denies easy bruising or easy bleeding. Patient denies any recent infections, allergies or URI. Patient "s visual fields have not changed significantly in recent time.    PHYSICAL EXAM: BP (!) 146/81 (BP Location: Left Arm, Patient Position: Sitting)   Pulse (!) 120   Temp (!) 97.5 F (36.4 C) (Tympanic)   Resp 16   Wt 266 lb 6.8 oz (120.8 kg)   BMI 47.20 kg/m  Patient is obese. She status post wide local excision in the region of the left nipple areolar complex. No dominant mass or nodularity is noted in either breast in 2 positions examined. No axillary or supraclavicular adenopathy is appreciated.Well-developed well-nourished patient in NAD. HEENT reveals PERLA, EOMI, discs not visualized.  Oral cavity is clear. No oral mucosal lesions are identified. Neck is clear without evidence of cervical or supraclavicular adenopathy. Lungs are clear to A&P. Cardiac examination is essentially unremarkable with regular rate and rhythm without murmur rub or thrill. Abdomen is benign with no organomegaly or masses noted. Motor sensory and DTR levels are equal and symmetric in the upper and lower extremities. Cranial nerves II through XII are grossly intact. Proprioception is intact. No peripheral adenopathy or edema is identified. No motor or sensory levels are noted. Crude visual fields are within normal range.  LABORATORY DATA: pathology reports reviewed    RADIOLOGY RESULTS:mammogram and ultrasound  reports reviewed   IMPRESSION: stage I invasive mammary carcinoma the left breast as was wide local excision in 43 year old female ER/PR positive.  PLAN: at this time based the patient's large breast would recommend a standard course of radiation therapy. Would plan on delivering 5040 cGy in 28 fractions to her left breast. I would also boost her scar another 1600 cGy based on the close 2 mm margin. Risks and benefits of treatment including skin reaction fatigue alteration of blood counts possible inclusion of superficial lung all were discussed in detail with the patient. She seems to comprehend my treatment plan well. I personally separate ordered CT simulation for early next week. Patient will also benefit from from antiestrogen therapy after completion of radiation and will return to [facility] for that recommendation.  I would like to take this opportunity to thank you for allowing me to participate in the care of your patient.Noreene Filbert, MD

## 2018-04-18 ENCOUNTER — Ambulatory Visit: Payer: 59

## 2018-04-23 ENCOUNTER — Ambulatory Visit
Admission: RE | Admit: 2018-04-23 | Discharge: 2018-04-23 | Disposition: A | Discharge status: Home / Self Care | Payer: 59 | Source: Ambulatory Visit | Attending: Radiation Oncology | Admitting: Radiation Oncology

## 2018-04-23 DIAGNOSIS — Z79899 Other long term (current) drug therapy: Secondary | ICD-10-CM | POA: Insufficient documentation

## 2018-04-23 DIAGNOSIS — J45909 Unspecified asthma, uncomplicated: Secondary | ICD-10-CM | POA: Diagnosis not present

## 2018-04-23 DIAGNOSIS — C7981 Secondary malignant neoplasm of breast: Secondary | ICD-10-CM | POA: Diagnosis not present

## 2018-04-23 DIAGNOSIS — Z8052 Family history of malignant neoplasm of bladder: Secondary | ICD-10-CM | POA: Insufficient documentation

## 2018-04-23 DIAGNOSIS — Z9089 Acquired absence of other organs: Secondary | ICD-10-CM | POA: Insufficient documentation

## 2018-04-23 DIAGNOSIS — K219 Gastro-esophageal reflux disease without esophagitis: Secondary | ICD-10-CM | POA: Diagnosis not present

## 2018-04-24 DIAGNOSIS — C7981 Secondary malignant neoplasm of breast: Secondary | ICD-10-CM | POA: Diagnosis not present

## 2018-04-26 ENCOUNTER — Other Ambulatory Visit: Payer: Self-pay | Admitting: *Deleted

## 2018-04-26 DIAGNOSIS — C50112 Malignant neoplasm of central portion of left female breast: Secondary | ICD-10-CM

## 2018-04-26 DIAGNOSIS — Z170 Estrogen receptor positive status [ER+]: Principal | ICD-10-CM

## 2018-04-30 ENCOUNTER — Other Ambulatory Visit: Payer: Self-pay

## 2018-04-30 ENCOUNTER — Ambulatory Visit
Admission: RE | Admit: 2018-04-30 | Discharge: 2018-04-30 | Disposition: A | Discharge status: Home / Self Care | Payer: 59 | Source: Ambulatory Visit | Attending: Radiation Oncology | Admitting: Radiation Oncology

## 2018-04-30 DIAGNOSIS — C7981 Secondary malignant neoplasm of breast: Secondary | ICD-10-CM | POA: Diagnosis not present

## 2018-05-01 ENCOUNTER — Ambulatory Visit
Admission: RE | Admit: 2018-05-01 | Discharge: 2018-05-01 | Disposition: A | Discharge status: Home / Self Care | Payer: 59 | Source: Ambulatory Visit | Attending: Radiation Oncology | Admitting: Radiation Oncology

## 2018-05-01 DIAGNOSIS — C7981 Secondary malignant neoplasm of breast: Secondary | ICD-10-CM | POA: Diagnosis not present

## 2018-05-02 ENCOUNTER — Other Ambulatory Visit: Payer: Self-pay

## 2018-05-02 ENCOUNTER — Ambulatory Visit
Admission: RE | Admit: 2018-05-02 | Discharge: 2018-05-02 | Disposition: A | Discharge status: Home / Self Care | Payer: 59 | Source: Ambulatory Visit | Attending: Radiation Oncology | Admitting: Radiation Oncology

## 2018-05-02 DIAGNOSIS — C7981 Secondary malignant neoplasm of breast: Secondary | ICD-10-CM | POA: Diagnosis not present

## 2018-05-03 ENCOUNTER — Ambulatory Visit
Admission: RE | Admit: 2018-05-03 | Discharge: 2018-05-03 | Disposition: A | Discharge status: Home / Self Care | Payer: 59 | Source: Ambulatory Visit | Attending: Radiation Oncology | Admitting: Radiation Oncology

## 2018-05-03 ENCOUNTER — Other Ambulatory Visit: Payer: Self-pay

## 2018-05-03 DIAGNOSIS — C7981 Secondary malignant neoplasm of breast: Secondary | ICD-10-CM | POA: Diagnosis not present

## 2018-05-05 ENCOUNTER — Other Ambulatory Visit: Payer: Self-pay

## 2018-05-06 ENCOUNTER — Ambulatory Visit
Admission: RE | Admit: 2018-05-06 | Discharge: 2018-05-06 | Disposition: A | Discharge status: Home / Self Care | Payer: 59 | Source: Ambulatory Visit | Attending: Radiation Oncology | Admitting: Radiation Oncology

## 2018-05-06 ENCOUNTER — Other Ambulatory Visit: Payer: Self-pay

## 2018-05-06 DIAGNOSIS — C7981 Secondary malignant neoplasm of breast: Secondary | ICD-10-CM | POA: Diagnosis not present

## 2018-05-07 ENCOUNTER — Ambulatory Visit
Admission: RE | Admit: 2018-05-07 | Discharge: 2018-05-07 | Disposition: A | Discharge status: Home / Self Care | Payer: 59 | Source: Ambulatory Visit | Attending: Radiation Oncology | Admitting: Radiation Oncology

## 2018-05-07 ENCOUNTER — Ambulatory Visit: Payer: 59

## 2018-05-07 ENCOUNTER — Other Ambulatory Visit: Payer: Self-pay

## 2018-05-07 DIAGNOSIS — C7981 Secondary malignant neoplasm of breast: Secondary | ICD-10-CM | POA: Diagnosis not present

## 2018-05-08 ENCOUNTER — Ambulatory Visit
Admission: RE | Admit: 2018-05-08 | Discharge: 2018-05-08 | Disposition: A | Discharge status: Home / Self Care | Payer: 59 | Source: Ambulatory Visit | Attending: Radiation Oncology | Admitting: Radiation Oncology

## 2018-05-08 ENCOUNTER — Ambulatory Visit: Payer: 59

## 2018-05-08 ENCOUNTER — Other Ambulatory Visit: Payer: Self-pay

## 2018-05-08 DIAGNOSIS — C7981 Secondary malignant neoplasm of breast: Secondary | ICD-10-CM | POA: Diagnosis not present

## 2018-05-09 ENCOUNTER — Encounter: Payer: Self-pay | Admitting: *Deleted

## 2018-05-09 ENCOUNTER — Other Ambulatory Visit: Payer: Self-pay

## 2018-05-09 ENCOUNTER — Ambulatory Visit: Payer: 59

## 2018-05-09 ENCOUNTER — Ambulatory Visit
Admission: RE | Admit: 2018-05-09 | Discharge: 2018-05-09 | Disposition: A | Discharge status: Home / Self Care | Payer: 59 | Source: Ambulatory Visit | Attending: Radiation Oncology | Admitting: Radiation Oncology

## 2018-05-09 DIAGNOSIS — C7981 Secondary malignant neoplasm of breast: Secondary | ICD-10-CM | POA: Diagnosis not present

## 2018-05-10 ENCOUNTER — Ambulatory Visit: Payer: 59

## 2018-05-10 ENCOUNTER — Ambulatory Visit
Admission: RE | Admit: 2018-05-10 | Discharge: 2018-05-10 | Disposition: A | Discharge status: Home / Self Care | Payer: 59 | Source: Ambulatory Visit | Attending: Radiation Oncology | Admitting: Radiation Oncology

## 2018-05-10 ENCOUNTER — Other Ambulatory Visit: Payer: Self-pay

## 2018-05-10 DIAGNOSIS — C7981 Secondary malignant neoplasm of breast: Secondary | ICD-10-CM | POA: Diagnosis not present

## 2018-05-13 ENCOUNTER — Other Ambulatory Visit: Payer: Self-pay

## 2018-05-13 ENCOUNTER — Ambulatory Visit
Admission: RE | Admit: 2018-05-13 | Discharge: 2018-05-13 | Disposition: A | Discharge status: Home / Self Care | Payer: 59 | Source: Ambulatory Visit | Attending: Radiation Oncology | Admitting: Radiation Oncology

## 2018-05-13 DIAGNOSIS — C7981 Secondary malignant neoplasm of breast: Secondary | ICD-10-CM | POA: Diagnosis not present

## 2018-05-14 ENCOUNTER — Ambulatory Visit
Admission: RE | Admit: 2018-05-14 | Discharge: 2018-05-14 | Disposition: A | Discharge status: Home / Self Care | Payer: 59 | Source: Ambulatory Visit | Attending: Radiation Oncology | Admitting: Radiation Oncology

## 2018-05-14 ENCOUNTER — Other Ambulatory Visit: Payer: Self-pay

## 2018-05-14 DIAGNOSIS — C7981 Secondary malignant neoplasm of breast: Secondary | ICD-10-CM | POA: Diagnosis not present

## 2018-05-15 ENCOUNTER — Other Ambulatory Visit: Payer: Self-pay

## 2018-05-15 ENCOUNTER — Ambulatory Visit
Admission: RE | Admit: 2018-05-15 | Discharge: 2018-05-15 | Disposition: A | Discharge status: Home / Self Care | Payer: 59 | Source: Ambulatory Visit | Attending: Radiation Oncology | Admitting: Radiation Oncology

## 2018-05-15 DIAGNOSIS — C50912 Malignant neoplasm of unspecified site of left female breast: Secondary | ICD-10-CM | POA: Diagnosis not present

## 2018-05-15 DIAGNOSIS — Z9089 Acquired absence of other organs: Secondary | ICD-10-CM

## 2018-05-15 DIAGNOSIS — Z8052 Family history of malignant neoplasm of bladder: Secondary | ICD-10-CM | POA: Insufficient documentation

## 2018-05-15 DIAGNOSIS — Z79899 Other long term (current) drug therapy: Secondary | ICD-10-CM | POA: Insufficient documentation

## 2018-05-15 DIAGNOSIS — K219 Gastro-esophageal reflux disease without esophagitis: Secondary | ICD-10-CM

## 2018-05-15 DIAGNOSIS — J45909 Unspecified asthma, uncomplicated: Secondary | ICD-10-CM | POA: Insufficient documentation

## 2018-05-15 DIAGNOSIS — C7981 Secondary malignant neoplasm of breast: Secondary | ICD-10-CM | POA: Insufficient documentation

## 2018-05-15 DIAGNOSIS — Z170 Estrogen receptor positive status [ER+]: Secondary | ICD-10-CM | POA: Diagnosis not present

## 2018-05-15 DIAGNOSIS — Z510 Encounter for antineoplastic radiation therapy: Secondary | ICD-10-CM | POA: Diagnosis present

## 2018-05-16 ENCOUNTER — Ambulatory Visit: Payer: 59

## 2018-05-16 ENCOUNTER — Inpatient Hospital Stay: Payer: 59 | Attending: Radiation Oncology

## 2018-05-16 DIAGNOSIS — C50912 Malignant neoplasm of unspecified site of left female breast: Secondary | ICD-10-CM | POA: Insufficient documentation

## 2018-05-16 DIAGNOSIS — Z510 Encounter for antineoplastic radiation therapy: Secondary | ICD-10-CM | POA: Insufficient documentation

## 2018-05-16 DIAGNOSIS — Z170 Estrogen receptor positive status [ER+]: Secondary | ICD-10-CM | POA: Insufficient documentation

## 2018-05-17 ENCOUNTER — Ambulatory Visit: Payer: 59

## 2018-05-20 ENCOUNTER — Ambulatory Visit
Admission: RE | Admit: 2018-05-20 | Discharge: 2018-05-20 | Disposition: A | Discharge status: Home / Self Care | Payer: 59 | Source: Ambulatory Visit | Attending: Radiation Oncology | Admitting: Radiation Oncology

## 2018-05-20 ENCOUNTER — Other Ambulatory Visit: Payer: Self-pay

## 2018-05-20 DIAGNOSIS — Z510 Encounter for antineoplastic radiation therapy: Secondary | ICD-10-CM | POA: Diagnosis not present

## 2018-05-21 ENCOUNTER — Ambulatory Visit
Admission: RE | Admit: 2018-05-21 | Discharge: 2018-05-21 | Disposition: A | Discharge status: Home / Self Care | Payer: 59 | Source: Ambulatory Visit | Attending: Radiation Oncology | Admitting: Radiation Oncology

## 2018-05-21 ENCOUNTER — Other Ambulatory Visit: Payer: Self-pay

## 2018-05-21 DIAGNOSIS — Z510 Encounter for antineoplastic radiation therapy: Secondary | ICD-10-CM | POA: Diagnosis not present

## 2018-05-22 ENCOUNTER — Other Ambulatory Visit: Payer: Self-pay

## 2018-05-22 ENCOUNTER — Ambulatory Visit
Admission: RE | Admit: 2018-05-22 | Discharge: 2018-05-22 | Disposition: A | Discharge status: Home / Self Care | Payer: 59 | Source: Ambulatory Visit | Attending: Radiation Oncology | Admitting: Radiation Oncology

## 2018-05-22 DIAGNOSIS — Z510 Encounter for antineoplastic radiation therapy: Secondary | ICD-10-CM | POA: Diagnosis not present

## 2018-05-23 ENCOUNTER — Ambulatory Visit
Admission: RE | Admit: 2018-05-23 | Discharge: 2018-05-23 | Disposition: A | Discharge status: Home / Self Care | Payer: 59 | Source: Ambulatory Visit | Attending: Radiation Oncology | Admitting: Radiation Oncology

## 2018-05-23 ENCOUNTER — Other Ambulatory Visit: Payer: Self-pay

## 2018-05-23 DIAGNOSIS — Z510 Encounter for antineoplastic radiation therapy: Secondary | ICD-10-CM | POA: Diagnosis not present

## 2018-05-24 ENCOUNTER — Other Ambulatory Visit: Payer: Self-pay

## 2018-05-24 ENCOUNTER — Ambulatory Visit
Admission: RE | Admit: 2018-05-24 | Discharge: 2018-05-24 | Disposition: A | Discharge status: Home / Self Care | Payer: 59 | Source: Ambulatory Visit | Attending: Radiation Oncology | Admitting: Radiation Oncology

## 2018-05-24 DIAGNOSIS — Z510 Encounter for antineoplastic radiation therapy: Secondary | ICD-10-CM | POA: Diagnosis not present

## 2018-05-26 ENCOUNTER — Other Ambulatory Visit: Payer: Self-pay

## 2018-05-27 ENCOUNTER — Ambulatory Visit: Payer: 59

## 2018-05-28 ENCOUNTER — Other Ambulatory Visit: Payer: Self-pay

## 2018-05-28 ENCOUNTER — Ambulatory Visit
Admission: RE | Admit: 2018-05-28 | Discharge: 2018-05-28 | Disposition: A | Discharge status: Home / Self Care | Payer: 59 | Source: Ambulatory Visit | Attending: Radiation Oncology | Admitting: Radiation Oncology

## 2018-05-28 ENCOUNTER — Ambulatory Visit: Payer: 59

## 2018-05-28 DIAGNOSIS — Z510 Encounter for antineoplastic radiation therapy: Secondary | ICD-10-CM | POA: Diagnosis not present

## 2018-05-29 ENCOUNTER — Ambulatory Visit
Admission: RE | Admit: 2018-05-29 | Discharge: 2018-05-29 | Disposition: A | Discharge status: Home / Self Care | Payer: 59 | Source: Ambulatory Visit | Attending: Radiation Oncology | Admitting: Radiation Oncology

## 2018-05-29 ENCOUNTER — Other Ambulatory Visit: Payer: Self-pay

## 2018-05-29 ENCOUNTER — Ambulatory Visit: Payer: 59

## 2018-05-29 DIAGNOSIS — Z510 Encounter for antineoplastic radiation therapy: Secondary | ICD-10-CM | POA: Diagnosis not present

## 2018-05-30 ENCOUNTER — Inpatient Hospital Stay: Payer: 59 | Admitting: *Deleted

## 2018-05-30 ENCOUNTER — Ambulatory Visit: Payer: 59

## 2018-05-30 ENCOUNTER — Ambulatory Visit
Admission: RE | Admit: 2018-05-30 | Discharge: 2018-05-30 | Disposition: A | Discharge status: Home / Self Care | Payer: 59 | Source: Ambulatory Visit | Attending: Radiation Oncology | Admitting: Radiation Oncology

## 2018-05-30 ENCOUNTER — Other Ambulatory Visit: Payer: Self-pay

## 2018-05-30 DIAGNOSIS — Z510 Encounter for antineoplastic radiation therapy: Secondary | ICD-10-CM | POA: Diagnosis not present

## 2018-05-30 DIAGNOSIS — C50112 Malignant neoplasm of central portion of left female breast: Secondary | ICD-10-CM

## 2018-05-30 DIAGNOSIS — Z170 Estrogen receptor positive status [ER+]: Principal | ICD-10-CM

## 2018-05-30 LAB — CBC
HCT: 42.2 % (ref 36.0–46.0)
Hemoglobin: 13.6 g/dL (ref 12.0–15.0)
MCH: 26.7 pg (ref 26.0–34.0)
MCHC: 32.2 g/dL (ref 30.0–36.0)
MCV: 82.7 fL (ref 80.0–100.0)
Platelets: 343 10*3/uL (ref 150–400)
RBC: 5.1 MIL/uL (ref 3.87–5.11)
RDW: 13.7 % (ref 11.5–15.5)
WBC: 11.3 10*3/uL — ABNORMAL HIGH (ref 4.0–10.5)
nRBC: 0 % (ref 0.0–0.2)

## 2018-05-30 NOTE — Progress Notes (Signed)
Pt was faint with lab draw, clammy and diaphorectic.  Patient laid down in chair and placed pt's feel upwards.  Patient given water to drink.  VSS, after about 5 mins pt reports feeling better.  NO further diaphoresis noted, pt assisted to sit up.  Vitals remain stable, pt given coke to drink. Pt reports that her appetite has been down and she has not been taking po fluids in adequate volumes today.  Dr. Olena Leatherwood nurses K. Dawkins and A. Moore RN notified.  Pt without further s/s, pt d/c'd home, instructed her to come back in if she felt other problems.  Encouraged po fluids. Voiced understanding.

## 2018-05-31 ENCOUNTER — Other Ambulatory Visit: Payer: Self-pay

## 2018-05-31 ENCOUNTER — Ambulatory Visit: Payer: 59

## 2018-05-31 ENCOUNTER — Ambulatory Visit
Admission: RE | Admit: 2018-05-31 | Discharge: 2018-05-31 | Disposition: A | Discharge status: Home / Self Care | Payer: 59 | Source: Ambulatory Visit | Attending: Radiation Oncology | Admitting: Radiation Oncology

## 2018-05-31 DIAGNOSIS — Z510 Encounter for antineoplastic radiation therapy: Secondary | ICD-10-CM | POA: Diagnosis not present

## 2018-06-03 ENCOUNTER — Ambulatory Visit
Admission: RE | Admit: 2018-06-03 | Discharge: 2018-06-03 | Disposition: A | Discharge status: Home / Self Care | Payer: 59 | Source: Ambulatory Visit | Attending: Radiation Oncology | Admitting: Radiation Oncology

## 2018-06-03 ENCOUNTER — Other Ambulatory Visit: Payer: Self-pay

## 2018-06-03 DIAGNOSIS — Z510 Encounter for antineoplastic radiation therapy: Secondary | ICD-10-CM | POA: Diagnosis not present

## 2018-06-04 ENCOUNTER — Other Ambulatory Visit: Payer: Self-pay

## 2018-06-04 ENCOUNTER — Ambulatory Visit
Admission: RE | Admit: 2018-06-04 | Discharge: 2018-06-04 | Disposition: A | Discharge status: Home / Self Care | Payer: 59 | Source: Ambulatory Visit | Attending: Radiation Oncology | Admitting: Radiation Oncology

## 2018-06-04 ENCOUNTER — Other Ambulatory Visit: Payer: Self-pay | Admitting: *Deleted

## 2018-06-04 DIAGNOSIS — Z510 Encounter for antineoplastic radiation therapy: Secondary | ICD-10-CM | POA: Diagnosis not present

## 2018-06-04 MED ORDER — SILVER SULFADIAZINE 1 % EX CREA: 1.0000 "application " | TOPICAL_CREAM | Freq: Two times a day (BID) | CUTANEOUS | 4 refills | Status: DC

## 2018-06-05 ENCOUNTER — Other Ambulatory Visit: Payer: Self-pay

## 2018-06-05 ENCOUNTER — Ambulatory Visit
Admission: RE | Admit: 2018-06-05 | Discharge: 2018-06-05 | Disposition: A | Discharge status: Home / Self Care | Payer: 59 | Source: Ambulatory Visit | Attending: Radiation Oncology | Admitting: Radiation Oncology

## 2018-06-05 DIAGNOSIS — Z510 Encounter for antineoplastic radiation therapy: Secondary | ICD-10-CM | POA: Diagnosis not present

## 2018-06-06 ENCOUNTER — Ambulatory Visit
Admission: RE | Admit: 2018-06-06 | Discharge: 2018-06-06 | Disposition: A | Discharge status: Home / Self Care | Payer: 59 | Source: Ambulatory Visit | Attending: Radiation Oncology | Admitting: Radiation Oncology

## 2018-06-06 ENCOUNTER — Other Ambulatory Visit: Payer: Self-pay

## 2018-06-06 DIAGNOSIS — Z510 Encounter for antineoplastic radiation therapy: Secondary | ICD-10-CM | POA: Diagnosis not present

## 2018-06-07 ENCOUNTER — Ambulatory Visit: Payer: 59

## 2018-06-10 ENCOUNTER — Ambulatory Visit
Admission: RE | Admit: 2018-06-10 | Discharge: 2018-06-10 | Disposition: A | Discharge status: Home / Self Care | Payer: 59 | Source: Ambulatory Visit | Attending: Radiation Oncology | Admitting: Radiation Oncology

## 2018-06-10 ENCOUNTER — Other Ambulatory Visit: Payer: Self-pay

## 2018-06-10 ENCOUNTER — Ambulatory Visit: Payer: 59

## 2018-06-10 DIAGNOSIS — Z510 Encounter for antineoplastic radiation therapy: Secondary | ICD-10-CM | POA: Diagnosis not present

## 2018-06-11 ENCOUNTER — Ambulatory Visit: Payer: 59

## 2018-06-11 ENCOUNTER — Ambulatory Visit
Admission: RE | Admit: 2018-06-11 | Discharge: 2018-06-11 | Disposition: A | Discharge status: Home / Self Care | Payer: 59 | Source: Ambulatory Visit | Attending: Radiation Oncology | Admitting: Radiation Oncology

## 2018-06-11 ENCOUNTER — Other Ambulatory Visit: Payer: Self-pay

## 2018-06-11 DIAGNOSIS — Z510 Encounter for antineoplastic radiation therapy: Secondary | ICD-10-CM | POA: Diagnosis not present

## 2018-06-12 ENCOUNTER — Ambulatory Visit: Payer: 59

## 2018-06-12 ENCOUNTER — Other Ambulatory Visit: Payer: Self-pay

## 2018-06-12 ENCOUNTER — Ambulatory Visit
Admission: RE | Admit: 2018-06-12 | Discharge: 2018-06-12 | Disposition: A | Discharge status: Home / Self Care | Payer: 59 | Source: Ambulatory Visit | Attending: Radiation Oncology | Admitting: Radiation Oncology

## 2018-06-12 DIAGNOSIS — Z510 Encounter for antineoplastic radiation therapy: Secondary | ICD-10-CM | POA: Diagnosis not present

## 2018-06-13 ENCOUNTER — Ambulatory Visit
Admission: RE | Admit: 2018-06-13 | Discharge: 2018-06-13 | Disposition: A | Discharge status: Home / Self Care | Payer: 59 | Source: Ambulatory Visit | Attending: Radiation Oncology | Admitting: Radiation Oncology

## 2018-06-13 ENCOUNTER — Other Ambulatory Visit: Payer: Self-pay

## 2018-06-13 ENCOUNTER — Ambulatory Visit: Admission: RE | Admit: 2018-06-13 | Payer: 59 | Source: Ambulatory Visit

## 2018-06-13 ENCOUNTER — Inpatient Hospital Stay: Payer: 59

## 2018-06-13 ENCOUNTER — Ambulatory Visit: Payer: 59

## 2018-06-13 DIAGNOSIS — Z170 Estrogen receptor positive status [ER+]: Principal | ICD-10-CM

## 2018-06-13 DIAGNOSIS — C50112 Malignant neoplasm of central portion of left female breast: Secondary | ICD-10-CM

## 2018-06-13 DIAGNOSIS — Z510 Encounter for antineoplastic radiation therapy: Secondary | ICD-10-CM | POA: Diagnosis not present

## 2018-06-13 LAB — CBC
HCT: 39.8 % (ref 36.0–46.0)
Hemoglobin: 12.9 g/dL (ref 12.0–15.0)
MCH: 26.6 pg (ref 26.0–34.0)
MCHC: 32.4 g/dL (ref 30.0–36.0)
MCV: 82.1 fL (ref 80.0–100.0)
Platelets: 367 10*3/uL (ref 150–400)
RBC: 4.85 MIL/uL (ref 3.87–5.11)
RDW: 13.5 % (ref 11.5–15.5)
WBC: 9.2 10*3/uL (ref 4.0–10.5)
nRBC: 0 % (ref 0.0–0.2)

## 2018-06-14 ENCOUNTER — Other Ambulatory Visit: Payer: Self-pay

## 2018-06-14 ENCOUNTER — Ambulatory Visit
Admission: RE | Admit: 2018-06-14 | Discharge: 2018-06-14 | Disposition: A | Discharge status: Home / Self Care | Payer: 59 | Source: Ambulatory Visit | Attending: Radiation Oncology | Admitting: Radiation Oncology

## 2018-06-14 DIAGNOSIS — Z79899 Other long term (current) drug therapy: Secondary | ICD-10-CM | POA: Diagnosis not present

## 2018-06-14 DIAGNOSIS — K219 Gastro-esophageal reflux disease without esophagitis: Secondary | ICD-10-CM | POA: Diagnosis not present

## 2018-06-14 DIAGNOSIS — C7981 Secondary malignant neoplasm of breast: Secondary | ICD-10-CM | POA: Diagnosis present

## 2018-06-14 DIAGNOSIS — Z8052 Family history of malignant neoplasm of bladder: Secondary | ICD-10-CM | POA: Diagnosis not present

## 2018-06-14 DIAGNOSIS — J45909 Unspecified asthma, uncomplicated: Secondary | ICD-10-CM | POA: Diagnosis not present

## 2018-06-14 DIAGNOSIS — Z9089 Acquired absence of other organs: Secondary | ICD-10-CM | POA: Insufficient documentation

## 2018-06-17 ENCOUNTER — Ambulatory Visit: Payer: 59

## 2018-06-17 ENCOUNTER — Other Ambulatory Visit: Payer: Self-pay

## 2018-06-17 ENCOUNTER — Ambulatory Visit
Admission: RE | Admit: 2018-06-17 | Discharge: 2018-06-17 | Disposition: A | Discharge status: Home / Self Care | Payer: 59 | Source: Ambulatory Visit | Attending: Radiation Oncology | Admitting: Radiation Oncology

## 2018-06-17 DIAGNOSIS — C7981 Secondary malignant neoplasm of breast: Secondary | ICD-10-CM | POA: Diagnosis not present

## 2018-06-18 ENCOUNTER — Ambulatory Visit
Admission: RE | Admit: 2018-06-18 | Discharge: 2018-06-18 | Disposition: A | Discharge status: Home / Self Care | Payer: 59 | Source: Ambulatory Visit | Attending: Radiation Oncology | Admitting: Radiation Oncology

## 2018-06-18 ENCOUNTER — Ambulatory Visit: Payer: 59

## 2018-06-18 ENCOUNTER — Other Ambulatory Visit: Payer: Self-pay

## 2018-06-18 DIAGNOSIS — C7981 Secondary malignant neoplasm of breast: Secondary | ICD-10-CM | POA: Diagnosis not present

## 2018-06-19 ENCOUNTER — Ambulatory Visit
Admission: RE | Admit: 2018-06-19 | Discharge: 2018-06-19 | Disposition: A | Discharge status: Home / Self Care | Payer: 59 | Source: Ambulatory Visit | Attending: Radiation Oncology | Admitting: Radiation Oncology

## 2018-06-19 ENCOUNTER — Ambulatory Visit: Payer: 59

## 2018-06-19 ENCOUNTER — Other Ambulatory Visit: Payer: Self-pay

## 2018-06-19 DIAGNOSIS — C7981 Secondary malignant neoplasm of breast: Secondary | ICD-10-CM | POA: Diagnosis not present

## 2018-06-20 ENCOUNTER — Ambulatory Visit
Admission: RE | Admit: 2018-06-20 | Discharge: 2018-06-20 | Disposition: A | Discharge status: Home / Self Care | Payer: 59 | Source: Ambulatory Visit | Attending: Radiation Oncology | Admitting: Radiation Oncology

## 2018-06-20 ENCOUNTER — Ambulatory Visit: Payer: 59

## 2018-06-20 ENCOUNTER — Other Ambulatory Visit: Payer: Self-pay

## 2018-06-20 DIAGNOSIS — C7981 Secondary malignant neoplasm of breast: Secondary | ICD-10-CM | POA: Diagnosis not present

## 2018-06-21 ENCOUNTER — Ambulatory Visit: Payer: 59

## 2018-06-21 ENCOUNTER — Ambulatory Visit
Admission: RE | Admit: 2018-06-21 | Discharge: 2018-06-21 | Disposition: A | Discharge status: Home / Self Care | Payer: 59 | Source: Ambulatory Visit | Attending: Radiation Oncology | Admitting: Radiation Oncology

## 2018-06-21 ENCOUNTER — Other Ambulatory Visit: Payer: Self-pay

## 2018-06-21 DIAGNOSIS — C7981 Secondary malignant neoplasm of breast: Secondary | ICD-10-CM | POA: Diagnosis not present

## 2018-06-24 ENCOUNTER — Other Ambulatory Visit: Payer: Self-pay

## 2018-06-24 ENCOUNTER — Ambulatory Visit: Payer: 59

## 2018-06-24 ENCOUNTER — Ambulatory Visit
Admission: RE | Admit: 2018-06-24 | Discharge: 2018-06-24 | Disposition: A | Discharge status: Home / Self Care | Payer: 59 | Source: Ambulatory Visit | Attending: Radiation Oncology | Admitting: Radiation Oncology

## 2018-06-24 DIAGNOSIS — C7981 Secondary malignant neoplasm of breast: Secondary | ICD-10-CM | POA: Diagnosis not present

## 2018-06-25 ENCOUNTER — Ambulatory Visit: Payer: 59

## 2018-06-25 ENCOUNTER — Ambulatory Visit
Admission: RE | Admit: 2018-06-25 | Discharge: 2018-06-25 | Disposition: A | Discharge status: Home / Self Care | Payer: 59 | Source: Ambulatory Visit | Attending: Radiation Oncology | Admitting: Radiation Oncology

## 2018-06-25 ENCOUNTER — Other Ambulatory Visit: Payer: Self-pay

## 2018-06-25 DIAGNOSIS — C7981 Secondary malignant neoplasm of breast: Secondary | ICD-10-CM | POA: Diagnosis not present

## 2018-06-26 ENCOUNTER — Ambulatory Visit: Payer: 59

## 2018-07-29 ENCOUNTER — Other Ambulatory Visit: Payer: Self-pay

## 2018-07-29 ENCOUNTER — Encounter: Payer: Self-pay | Admitting: Radiation Oncology

## 2018-07-29 ENCOUNTER — Ambulatory Visit
Admission: RE | Admit: 2018-07-29 | Discharge: 2018-07-29 | Disposition: A | Discharge status: Home / Self Care | Payer: 59 | Source: Ambulatory Visit | Attending: Radiation Oncology | Admitting: Radiation Oncology

## 2018-07-29 VITALS — BP 127/89 | HR 88 | Temp 97.0°F | Resp 18 | Wt 267.2 lb

## 2018-07-29 DIAGNOSIS — Z170 Estrogen receptor positive status [ER+]: Secondary | ICD-10-CM | POA: Diagnosis not present

## 2018-07-29 DIAGNOSIS — R5383 Other fatigue: Secondary | ICD-10-CM | POA: Diagnosis not present

## 2018-07-29 DIAGNOSIS — Z923 Personal history of irradiation: Secondary | ICD-10-CM | POA: Diagnosis not present

## 2018-07-29 DIAGNOSIS — C50112 Malignant neoplasm of central portion of left female breast: Secondary | ICD-10-CM | POA: Diagnosis present

## 2018-07-29 NOTE — Progress Notes (Signed)
Radiation Oncology Follow up Note  Name: Melissa Williamson   Date:   07/29/2018 MRN:  [id number] DOB: 09-24-1975    This 43 y.o. female presents to the clinic today for 1 month follow-up status post radiation therapy to her left breast for stage I a invasive mammary carcinoma.  Tumor is ER PR positive  REFERRING PROVIDER: No ref. provider found  HPI: Patient is a 43 year old female now at 1 month having completed whole breast radiation to her left breast for stage I a ER PR positive invasive mammary carcinoma seen today in routine follow-up she is doing well.  She specifically denies breast tenderness cough or bone pain she states she is quite fatigued..  She has not yet started antiestrogen therapy but I believe she has the prescription in hand.  COMPLICATIONS OF TREATMENT: none  FOLLOW UP COMPLIANCE: keeps appointments   PHYSICAL EXAM:  BP 127/89 (BP Location: Right Arm, Patient Position: Sitting)   Pulse 88   Temp (!) 97 F (36.1 C) (Tympanic)   Resp 18   Wt 267 lb 3.2 oz (121.2 kg)   BMI 47.33 kg/m  Lungs are clear to A&P cardiac examination essentially unremarkable with regular rate and rhythm. No dominant mass or nodularity is noted in either breast in 2 positions examined. Incision is well-healed. No axillary or supraclavicular adenopathy is appreciated. Cosmetic result is excellent.  Well-developed well-nourished patient in NAD. HEENT reveals PERLA, EOMI, discs not visualized.  Oral cavity is clear. No oral mucosal lesions are identified. Neck is clear without evidence of cervical or supraclavicular adenopathy. Lungs are clear to A&P. Cardiac examination is essentially unremarkable with regular rate and rhythm without murmur rub or thrill. Abdomen is benign with no organomegaly or masses noted. Motor sensory and DTR levels are equal and symmetric in the upper and lower extremities. Cranial nerves II through XII are grossly intact. Proprioception is intact. No peripheral adenopathy or  edema is identified. No motor or sensory levels are noted. Crude visual fields are within normal range.  RADIOLOGY RESULTS: No current films for review  PLAN: Present time she is recovering well from her radiation therapy treatments.  She is slated to start antiestrogen therapy.  I have asked to see her back in 4 to 5 months for follow-up.  I have emphasized she needs to start exercising to alleviate some of her fatigue.  Patient knows to call with any concerns at any time.  I would like to take this opportunity to thank you for allowing me to participate in the care of your patient.Noreene Filbert, MD

## 2018-12-30 ENCOUNTER — Ambulatory Visit: Payer: 59 | Attending: Radiation Oncology | Admitting: Radiation Oncology

## 2019-01-23 ENCOUNTER — Encounter: Payer: 59 | Admitting: Obstetrics and Gynecology

## 2019-02-10 ENCOUNTER — Emergency Department
Admission: EM | Admit: 2019-02-10 | Discharge: 2019-02-10 | Disposition: A | Discharge status: Home / Self Care | Payer: 59 | Attending: Emergency Medicine | Admitting: Emergency Medicine

## 2019-02-10 ENCOUNTER — Other Ambulatory Visit: Payer: Self-pay

## 2019-02-10 ENCOUNTER — Encounter: Payer: Self-pay | Admitting: Medical Oncology

## 2019-02-10 DIAGNOSIS — M791 Myalgia, unspecified site: Secondary | ICD-10-CM | POA: Diagnosis not present

## 2019-02-10 DIAGNOSIS — Z5321 Procedure and treatment not carried out due to patient leaving prior to being seen by health care provider: Secondary | ICD-10-CM | POA: Diagnosis not present

## 2019-02-10 DIAGNOSIS — R42 Dizziness and giddiness: Secondary | ICD-10-CM | POA: Diagnosis not present

## 2019-02-10 DIAGNOSIS — R05 Cough: Secondary | ICD-10-CM | POA: Insufficient documentation

## 2019-02-10 DIAGNOSIS — R0602 Shortness of breath: Secondary | ICD-10-CM | POA: Insufficient documentation

## 2019-02-10 LAB — BASIC METABOLIC PANEL
Anion gap: 8 (ref 5–15)
BUN: 8 mg/dL (ref 6–20)
CO2: 25 mmol/L (ref 22–32)
Calcium: 8.4 mg/dL — ABNORMAL LOW (ref 8.9–10.3)
Chloride: 105 mmol/L (ref 98–111)
Creatinine, Ser: 0.88 mg/dL (ref 0.44–1.00)
GFR calc Af Amer: >60 mL/min (ref >60)
GFR calc non Af Amer: >60 mL/min (ref >60)
Glucose, Bld: 105 mg/dL — ABNORMAL HIGH (ref 70–99)
Potassium: 3.3 mmol/L — ABNORMAL LOW (ref 3.5–5.1)
Sodium: 138 mmol/L (ref 135–145)

## 2019-02-10 LAB — CBC
HCT: 37.2 % (ref 36.0–46.0)
Hemoglobin: 12.1 g/dL (ref 12.0–15.0)
MCH: 26.2 pg (ref 26.0–34.0)
MCHC: 32.5 g/dL (ref 30.0–36.0)
MCV: 80.7 fL (ref 80.0–100.0)
Platelets: 329 10*3/uL (ref 150–400)
RBC: 4.61 MIL/uL (ref 3.87–5.11)
RDW: 14.1 % (ref 11.5–15.5)
WBC: 4.1 10*3/uL (ref 4.0–10.5)
nRBC: 0 % (ref 0.0–0.2)

## 2019-02-10 NOTE — ED Triage Notes (Signed)
Pt called from [facility] to treatment room, no response 

## 2019-02-10 NOTE — ED Triage Notes (Signed)
Pt reports she went to bed last night with cough- woke up feeling sob, dizzy, and achy. Pt states that she has been in contact with some one with covid. Pt in NAD at this time.

## 2019-02-10 NOTE — ED Notes (Signed)
Pt sitting/lying on bench in lobby in no acute distress.

## 2020-08-13 ENCOUNTER — Other Ambulatory Visit
Admission: RE | Admit: 2020-08-13 | Discharge: 2020-08-13 | Disposition: A | Discharge status: Home / Self Care | Payer: Self-pay | Source: Ambulatory Visit | Attending: Pulmonary Disease | Admitting: Pulmonary Disease

## 2020-08-13 DIAGNOSIS — U099 Post covid-19 condition, unspecified: Secondary | ICD-10-CM | POA: Insufficient documentation

## 2020-08-13 DIAGNOSIS — R0609 Other forms of dyspnea: Secondary | ICD-10-CM | POA: Insufficient documentation

## 2020-08-13 LAB — D-DIMER, QUANTITATIVE: D-Dimer, Quant: <0.27 ug/mL-FEU (ref 0.00–0.50)

## 2022-03-24 ENCOUNTER — Ambulatory Visit
Admission: RE | Admit: 2022-03-24 | Discharge: 2022-03-24 | Disposition: A | Discharge status: Home / Self Care | Payer: No Typology Code available for payment source | Source: Ambulatory Visit | Attending: Pulmonary Disease | Admitting: Pulmonary Disease

## 2022-03-24 ENCOUNTER — Other Ambulatory Visit: Payer: Self-pay | Admitting: Pulmonary Disease

## 2022-03-24 DIAGNOSIS — R000 Tachycardia, unspecified: Secondary | ICD-10-CM

## 2022-03-24 MED ORDER — IOHEXOL 350 MG/ML SOLN
75.0000 mL | Freq: Once | INTRAVENOUS | Status: AC | PRN
  Administered 2022-03-24: 75 mL via INTRAVENOUS

## 2022-04-18 ENCOUNTER — Other Ambulatory Visit: Payer: Self-pay | Admitting: Neurology

## 2022-04-18 DIAGNOSIS — G43719 Chronic migraine without aura, intractable, without status migrainosus: Secondary | ICD-10-CM

## 2022-04-25 ENCOUNTER — Ambulatory Visit: Payer: No Typology Code available for payment source

## 2022-05-03 ENCOUNTER — Ambulatory Visit: Payer: No Typology Code available for payment source

## 2022-05-11 ENCOUNTER — Ambulatory Visit
Admission: RE | Admit: 2022-05-11 | Discharge: 2022-05-11 | Disposition: A | Discharge status: Home / Self Care | Payer: No Typology Code available for payment source | Source: Ambulatory Visit | Attending: Neurology | Admitting: Neurology

## 2022-05-11 DIAGNOSIS — G43719 Chronic migraine without aura, intractable, without status migrainosus: Secondary | ICD-10-CM | POA: Insufficient documentation

## 2022-05-11 MED ORDER — GADOBUTROL 1 MMOL/ML IV SOLN
10.0000 mL | Freq: Once | INTRAVENOUS | Status: AC | PRN
  Administered 2022-05-11: 10 mL via INTRAVENOUS

## 2022-07-07 LAB — COLOGUARD

## 2022-07-07 LAB — EXTERNAL GENERIC LAB PROCEDURE

## 2022-07-26 NOTE — Therapy (Deleted)
OUTPATIENT PHYSICAL THERAPY CERVICAL EVALUATION   Patient Name: Melissa Williamson MRN: [id number] DOB:January 30, 1976, 47 y.o., female Today's Date: 07/26/2022  END OF SESSION:   Past Medical History:  Diagnosis Date   Asthma    Gastritis    GERD (gastroesophageal reflux disease)    Migraine    Past Surgical History:  Procedure Laterality Date   CESAREAN SECTION     PARTIAL THYMECTOMY     THYROIDECTOMY     Patient Active Problem List   Diagnosis Date Noted   Anxiety and depression 10/08/2015   Asthma 10/08/2015   Chickenpox 10/08/2015   Hashimoto's thyroiditis 10/08/2015   Migraines 10/08/2015   Obesity 10/08/2015   Chest pain with moderate risk of acute coronary syndrome 11/28/2013   SOB (shortness of breath) on exertion 11/28/2013   Chronic constipation 10/28/2013   Esophageal dysphagia 10/28/2013   Thyroid disease 03/16/2011    PCP: Lynnea Ferrier, MD  REFERRING PROVIDER: Lonell Face, MD  REFERRING DIAG: cervicalgia  THERAPY DIAG:  No diagnosis found.  Rationale for Evaluation and Treatment: Rehabilitation  ONSET DATE: ***  SUBJECTIVE:                                                                                                                                                                                                         SUBJECTIVE STATEMENT: ***  PERTINENT HISTORY:  Patient is a 47 y.o. female who presents to outpatient physical therapy with a referral for medical diagnosis cervicalgia. This patient's chief complaints consist of ***, leading to the following functional deficits: ***. Relevant past medical history and comorbidities include Anxiety and depression, Asthma, B12 deficiency 04/17/2019, Hashimoto's thyroiditis, Migraines, Obesity, Right thyroid nodule, POTS, Vitamin D insufficiency, THYROIDECTOMY, PARTIAL 02/13/2010 Benign, chronic lyphocytic thyroiditis, CESAREAN SECTION.  Patient denies hx of {redflags:27294}    PAIN:  Are you  having pain? Yes: NPRS scale: Current: ***/10,  Best: ***/10, Worst: ***/10. Pain location: *** Pain description: *** Aggravating factors: *** Relieving factors: ***   FUNCTIONAL LIMITATIONS: ***  LEISURE: ***  PRECAUTIONS: {Therapy precautions:24002}  WEIGHT BEARING RESTRICTIONS: {Yes ***/No:24003}  FALLS:  Has patient fallen in last 6 months? {fallsyesno:27318}  LIVING ENVIRONMENT: Lives with: {[facility] lives with:25569::"lives with their family"} Lives in: {Lives in:25570} Stairs: {opstairs:27293} Has following equipment at home: {Assistive devices:23999}  OCCUPATION: ***  PLOF: {PLOF:24004}  PATIENT GOALS: ***  NEXT MD VISIT: ***  OBJECTIVE  DIAGNOSTIC FINDINGS:  Normal brain MRI 05/11/2022 C-spine xray 04/14/2022: ***   SELF- REPORTED FUNCTION FOTO score: ***/100 (*** questionnaire)  OBSERVATION/INSPECTION Posture Posture (  seated): forward head, rounded shoulders, slumped in sitting.  Posture (standing): *** Posture correction: *** Anthropometrics Tremor: none Body composition: *** Muscle bulk: *** Skin: The incision sites appear to be healing well with no excessive redness, warmth, drainage or signs of infection present.  *** Edema: *** Functional Mobility Bed mobility: *** Transfers: *** Gait: grossly WFL for household and short community ambulation. More detailed gait analysis deferred to later date as needed. *** Stairs: ***  SPINE MOTION  LUMBAR SPINE AROM *Indicates pain Flexion: *** Extension: *** Side Flexion:   R ***  L *** Rotation:  R *** L *** Side glide:  R *** L ***   NEUROLOGICAL  Upper Motor Neuron Screen Babinski, Hoffman's and Clonus (ankle) negative bilaterally.  Dermatomes C2-T1 appears equal and intact to light touch except the following: *** L2-S2 appears equal and intact to light touch except the following: *** Deep Tendon Reflexes R/L  ***+/***+ Biceps brachii reflex (C5, C6) ***+/***+ Brachioradialis reflex  (C6) ***+/***+ Triceps brachii reflex (C7) ***+/***+ Quadriceps reflex (L4) ***+/***+ Achilles reflex (S1)  SPINE MOTION  CERVICAL SPINE AROM *Indicates pain Flexion: *** Extension: *** Side Flexion:   R ***  L *** Rotation:  R *** L ***   PERIPHERAL JOINT MOTION (in degrees)  ACTIVE RANGE OF MOTION (AROM) *Indicates pain Date Date Date  Joint/Motion R/L R/L R/L  Shoulder     Flexion / / /  Extension / / /  Abduction  / / /  External rotation / / /  Internal rotation / / /  Elbow     Flexion  / / /  Extension  / / /  Wrist     Flexion / / /  Extension  / / /  Radial deviation / / /  Ulnar deviation / / /  Pronation / / /  Supination / / /  Hip     Flexion / / /  Extension  / / /  Abduction / / /  Adduction / / /  External rotation / / /  Internal rotation  / / /  Knee     Extension / / /  Flexoin / / /  Ankle/Foot     Dorsiflexion (knee ext) / / /  Dorsiflexion (knee flex) / / /  Plantarflexion / / /  Everison / / /  Inversion / / /  Great toe extension / / /  Great toe flexion / / /  Comments:   PASSIVE RANGE OF MOTION (PROM) *Indicates pain Date Date Date  Joint/Motion R/L R/L R/L  Shoulder     Flexion / / /  Extension / / /  Abduction  / / /  External rotation / / /  Internal rotation / / /  Elbow     Flexion  / / /  Extension  / / /  Wrist     Flexion / / /  Extension  / / /  Radial deviation / / /  Ulnar deviation / / /  Pronation / / /  Supination / / /  Hip     Flexion  / / /  Extension  / / /  Abduction / / /  Adduction / / /  External rotation / / /  Internal rotation  / / /  Knee     Extension / / /  Flexion / / /  Ankle/Foot     Dorsiflexion (knee ext) / / /  Dorsiflexion (knee  flex) / / /  Plantarflexion / / /  Everison / / /  Inversion / / /  Great toe extension / / /  Great toe flexion / / /  Comments:   MUSCLE PERFORMANCE (MMT):  *Indicates pain Date Date Date  Joint/Motion R/L R/L R/L  Shoulder      Flexion / / /  Abduction (C5) / / /  External rotation / / /  Internal rotation / / /  Extension / / /  Elbow     Flexion (C6) / / /  Extension (C7) / / /  Wrist     Flexion (C7) / / /  Extension (C6) / / /  Radial deviation / / /  Ulnar deviation (C8) / / /  Pronation / / /  Supination / / /  Hand     Thumb extension (C8) / / /  Finger abduction (T1) / / /  Grip (C8) / / /  Hip     Flexion (L1, L2) / / /  Extension (knee ext) / / /  Extension (knee flex) / / /  Abduction / / /  Adduction / / /  External rotation / / /  Internal rotation  / / /  Knee     Extension (L3) / / /  Flexion (S2) / / /  Ankle/Foot     Dorsiflexion (L4) / / /  Great toe extension (L5) / / /  Eversion (S1) / / /  Plantarflexion (S1) / / /  Inversion / / /  Pronation / / /  Great toe flexion / / /  Comments:   SPECIAL TESTS:  .Neurodynamictests .NeurodynamicUE .NeurodynamicLE .CspineInstability .CSPINESPECIALTESTS .SHOULDERSPECIALTESTCLUSTERS .HIPSPECIALTESTS .SIJSPECIALTESTS   SHOULDER SPECIAL TESTS RTC, Impingement, Anterior Instability (macrotrauma), Labral Tear: Painful arc test: R = ***, L = ***. Drop arm test: R = ***, L = ***. Hawkins-Kennedy test: R = ***, L = ***. Infraspinatus test: R = ***, L = ***. Apprehension test: R = ***, L = ***. Relocation test: R = ***, L = ***. Active compression test: R = ***, L = ***.  ACCESSORY MOTION: ***  PALPATION: ***  SUSTAINED POSITIONS TESTING:  ***  REPEATED MOTIONS TESTING: ***  FUNCTIONAL/BALANCE TESTS: Five Time Sit to Stand (5TSTS): *** seconds Functional Gait Assessment (FGA): ***/30 (see details above) Ten meter walking trial ( ): *** m/s Six Minute Walk Test ( ): *** feet Timed Up and Go (TUG): *** seconds   Dynamic Gait Index: ***/24 BERG Balance Scale: ***/56 Tinetti/POMA: ***/28 Timed Up and GO: *** seconds (average of 3 trials) Trial 1: *** Trial 2: *** Trial 3: *** Romberg test: -Narrow  stance, eyes open: *** seconds -Narrow stance, eyes closed: *** seconds Sharpened Romberg test: -Tandem stance, eyes open: *** seconds -Tandem stance, eyes closed: *** seconds  Narrow stance, firm surface, eyes open: *** seconds Narrow stance, firm surface, eyes closed: *** seconds Narrow stance, compliant surface, eyes open: *** seconds Narrow stance, compliant surface, eyes closed: *** seconds Single leg stance, firm surface, eyes open: R= *** seconds, L= *** seconds Single leg stance, compliant surface, eyes open: R= *** seconds, L= *** seconds Gait speed: *** m/s Functional reach test: *** inches      TODAY'S TREATMENT:  PATIENT EDUCATION:  Education details: *** Person educated: {Person educated:25204} Education method: {Education Method:25205} Education comprehension: {Education Comprehension:25206}  HOME EXERCISE PROGRAM: ***  ASSESSMENT:  CLINICAL IMPRESSION: Patient is a 47 y.o. female referred to outpatient physical  therapy with a medical diagnosis of cervicalgia who presents with signs and symptoms consistent with ***. Patient presents with significant *** impairments that are limiting ability to complete *** without difficulty. Patient will benefit from skilled physical therapy intervention to address current body structure impairments and activity limitations to improve function and work towards goals set in current POC in order to return to prior level of function or maximal functional improvement.     OBJECTIVE IMPAIRMENTS: {opptimpairments:25111}.   ACTIVITY LIMITATIONS: {activitylimitations:27494}  PARTICIPATION LIMITATIONS: {participationrestrictions:25113}  PERSONAL FACTORS: {Personal factors:25162} are also affecting patient's functional outcome.   REHAB POTENTIAL: {rehabpotential:25112}  CLINICAL DECISION MAKING: {clinical decision making:25114}  EVALUATION COMPLEXITY: {Evaluation complexity:25115}   GOALS: Goals reviewed with patient?  {yes/no:20286}  SHORT TERM GOALS: Target date: 08/09/2022  Patient will be independent with initial home exercise program for self-management of symptoms. Baseline: {HEPbaseline4:27310} (07/26/22); Goal status: INITIAL   LONG TERM GOALS: Target date: 10/18/2022  Patient will be independent with a long-term home exercise program for self-management of symptoms.  Baseline: {HEPbaseline4:27310} (07/26/22); Goal status: INITIAL  2.  Patient will demonstrate improved FOTO to equal or greater than *** by visit #*** to demonstrate improvement in overall condition and self-reported functional ability.  Baseline: *** (07/26/22); Goal status: INITIAL  3.  *** Baseline: *** (07/26/22); Goal status: INITIAL  4.  *** Baseline: *** (07/26/22); Goal status: INITIAL  5.  Patient will complete community, work and/or recreational activities without limitation due to current condition.  Baseline: *** (07/26/22); Goal status: INITIAL  6.  *** Baseline: *** Goal status: INITIAL    PLAN:  PT FREQUENCY: {rehab frequency:25116}  PT DURATION: {rehab duration:25117}  PLANNED INTERVENTIONS: {rehab planned interventions:25118::"Therapeutic exercises","Therapeutic activity","Neuromuscular re-education","Balance training","Gait training","Patient/Family education","Self Care","Joint mobilization"}  PLAN FOR NEXT SESSION: ***   Cira Rue, PT, DPT 07/26/2022, 4:27 PM  [facility] [address] [place], [place] [address] P: [phone] I F: [phone]

## 2022-07-27 ENCOUNTER — Ambulatory Visit: Payer: No Typology Code available for payment source | Attending: Neurology | Admitting: Physical Therapy

## 2022-07-31 ENCOUNTER — Ambulatory Visit: Payer: No Typology Code available for payment source | Admitting: Physical Therapy

## 2022-08-02 ENCOUNTER — Encounter: Payer: No Typology Code available for payment source | Admitting: Physical Therapy

## 2022-08-07 ENCOUNTER — Encounter: Payer: No Typology Code available for payment source | Admitting: Physical Therapy

## 2022-08-14 ENCOUNTER — Encounter: Payer: No Typology Code available for payment source | Admitting: Physical Therapy

## 2022-08-16 ENCOUNTER — Encounter: Payer: No Typology Code available for payment source | Admitting: Physical Therapy

## 2022-08-28 ENCOUNTER — Encounter: Payer: No Typology Code available for payment source | Admitting: Physical Therapy

## 2022-08-30 ENCOUNTER — Encounter: Payer: No Typology Code available for payment source | Admitting: Physical Therapy

## 2022-08-31 ENCOUNTER — Other Ambulatory Visit: Payer: Self-pay | Admitting: Internal Medicine

## 2022-08-31 DIAGNOSIS — Z1231 Encounter for screening mammogram for malignant neoplasm of breast: Secondary | ICD-10-CM

## 2022-09-04 ENCOUNTER — Encounter: Payer: No Typology Code available for payment source | Admitting: Physical Therapy

## 2022-09-06 ENCOUNTER — Encounter: Payer: No Typology Code available for payment source | Admitting: Physical Therapy

## 2022-09-11 ENCOUNTER — Encounter: Payer: No Typology Code available for payment source | Admitting: Physical Therapy

## 2022-09-13 ENCOUNTER — Encounter: Payer: No Typology Code available for payment source | Admitting: Physical Therapy

## 2022-12-13 ENCOUNTER — Ambulatory Visit
Admission: RE | Admit: 2022-12-13 | Discharge: 2022-12-13 | Disposition: A | Discharge status: Home / Self Care | Payer: No Typology Code available for payment source | Source: Ambulatory Visit | Attending: Internal Medicine | Admitting: Internal Medicine

## 2022-12-13 DIAGNOSIS — Z1231 Encounter for screening mammogram for malignant neoplasm of breast: Secondary | ICD-10-CM | POA: Diagnosis present

## 2022-12-13 HISTORY — DX: Personal history of irradiation: Z92.3

## 2022-12-14 ENCOUNTER — Other Ambulatory Visit
Admission: RE | Admit: 2022-12-14 | Discharge: 2022-12-14 | Disposition: A | Discharge status: Home / Self Care | Payer: No Typology Code available for payment source | Source: Ambulatory Visit | Attending: Internal Medicine | Admitting: Internal Medicine

## 2022-12-14 ENCOUNTER — Ambulatory Visit
Admission: RE | Admit: 2022-12-14 | Discharge: 2022-12-14 | Disposition: A | Discharge status: Home / Self Care | Payer: Self-pay | Source: Ambulatory Visit | Attending: Internal Medicine | Admitting: Internal Medicine

## 2022-12-14 ENCOUNTER — Ambulatory Visit: Payer: No Typology Code available for payment source | Attending: Internal Medicine | Admitting: Internal Medicine

## 2022-12-14 ENCOUNTER — Encounter: Payer: Self-pay | Admitting: Internal Medicine

## 2022-12-14 ENCOUNTER — Telehealth: Payer: Self-pay | Admitting: Internal Medicine

## 2022-12-14 ENCOUNTER — Other Ambulatory Visit: Payer: Self-pay | Admitting: *Deleted

## 2022-12-14 VITALS — BP 106/78 | HR 73 | Ht 63.0 in | Wt 259.0 lb

## 2022-12-14 DIAGNOSIS — Z1231 Encounter for screening mammogram for malignant neoplasm of breast: Secondary | ICD-10-CM

## 2022-12-14 DIAGNOSIS — R079 Chest pain, unspecified: Secondary | ICD-10-CM | POA: Insufficient documentation

## 2022-12-14 DIAGNOSIS — K5909 Other constipation: Secondary | ICD-10-CM | POA: Diagnosis not present

## 2022-12-14 NOTE — Patient Instructions (Addendum)
Medication Instructions:  Your physician recommends that you continue on your current medications as directed. Please refer to the Current Medication list given to you today.  *If you need a refill on your cardiac medications before your next appointment, please call your pharmacy*  Lab Work: Your provider would like for you to have following labs drawn today Acetylcholine receptor, blocking Abs .   If you have labs (blood work) drawn today and your tests are completely normal, you will receive your results only by: MyChart Message (if you have MyChart) OR A paper copy in the mail If you have any lab test that is abnormal or we need to change your treatment, we will call you to review the results.  Testing/Procedures: - None ordered  Follow-Up: At [facility], you and your health needs are our priority.  As part of our continuing mission to provide you with exceptional heart care, we have created designated Provider Care Teams.  These Care Teams include your primary Cardiologist (physician) and Advanced Practice Providers (APPs -  Physician Assistants and Nurse Practitioners) who all work together to provide you with the care you need, when you need it.  Your next appointment:   14 - 16 week(s) Telehealth  Provider:   Sherryl Manges, MD    Other Instructions Discussed salt substitutes with a  goal of 7-10 gms of Sodium or 12-15 gm of sodium Chloride daily.  Rehydration solutions include Liquid IV, NUUN, TriOral, Normralyte pedialyte advanced care and Banana Bags.  Salt tablets include plain salt tablets, SaltStick Vitassium, thermatabs amongst others.   The higher sodium concentration per serving on this list Within You hydration formula also at 500 mg normalyte about 800 mg of sodium per serving [id number] mg     Salt supplements are best used with adjunctive sugar  Also discussed the role of compression wear including thigh sleeves and abdominal binders.  Calf compression is not  specifically recommended..   Raise head of bed 6"  [facility]  Exercise per [facility] protocol  Ambulatory referral to Gastroenterology To - Rennis Chris, Gastroenterology

## 2022-12-14 NOTE — Telephone Encounter (Signed)
  Patient Consent for Virtual Visit        Melissa Williamson has provided verbal consent on 12/14/2022 for a virtual visit (video or telephone).   CONSENT FOR VIRTUAL VISIT FOR:  Melissa Williamson  By participating in this virtual visit I agree to the following:  I hereby voluntarily request, consent and authorize [facility] and its employed or contracted physicians, physician assistants, nurse practitioners or other licensed health care professionals (the Practitioner), to provide me with telemedicine health care services (the "Services") as deemed necessary by the treating Practitioner. I acknowledge and consent to receive the Services by the Practitioner via telemedicine. I understand that the telemedicine visit will involve communicating with the Practitioner through live audiovisual communication technology and the disclosure of certain medical information by electronic transmission. I acknowledge that I have been given the opportunity to request an in-person assessment or other available alternative prior to the telemedicine visit and am voluntarily participating in the telemedicine visit.  I understand that I have the right to withhold or withdraw my consent to the use of telemedicine in the course of my care at any time, without affecting my right to future care or treatment, and that the Practitioner or I may terminate the telemedicine visit at any time. I understand that I have the right to inspect all information obtained and/or recorded in the course of the telemedicine visit and may receive copies of available information for a reasonable fee.  I understand that some of the potential risks of receiving the Services via telemedicine include:  Delay or interruption in medical evaluation due to technological equipment failure or disruption; Information transmitted may not be sufficient (e.g. poor resolution of images) to allow for appropriate medical decision making by the  Practitioner; and/or  In rare instances, security protocols could fail, causing a breach of personal health information.  Furthermore, I acknowledge that it is my responsibility to provide information about my medical history, conditions and care that is complete and accurate to the best of my ability. I acknowledge that Practitioner's advice, recommendations, and/or decision may be based on factors not within their control, such as incomplete or inaccurate data provided by me or distortions of diagnostic images or specimens that may result from electronic transmissions. I understand that the practice of medicine is not an exact science and that Practitioner makes no warranties or guarantees regarding treatment outcomes. I acknowledge that a copy of this consent can be made available to me via my patient portal [facility] MyChart), or I can request a printed copy by calling the office of [facility].    I understand that my insurance will be billed for this visit.   I have read or had this consent read to me. I understand the contents of this consent, which adequately explains the benefits and risks of the Services being provided via telemedicine.  I have been provided ample opportunity to ask questions regarding this consent and the Services and have had my questions answered to my satisfaction. I give my informed consent for the services to be provided through the use of telemedicine in my medical care

## 2022-12-14 NOTE — Progress Notes (Signed)
ELECTROPHYSIOLOGY CONSULT NOTE  Patient ID: Melissa Williamson, MRN: [id number], DOB/AGE: 05/16/1975 47 y.o. Admit date: (Not on file) Date of Consult: 12/14/2022  Primary Physician: Melissa Ferrier, MD Primary Cardiologist: [facility]     Melissa Williamson is a 47 y.o. female who is being seen today for the evaluation of POTS at the request of [facility].    HPI Melissa Williamson is a 47 y.o. female with a 15-year history dating back to the birth of her first son Melissa Shearer) of nausea associated with difficulty eating, and chronic constipation chronic migraines with worsening headaches and photophobia, progressive fatigue, orthostatic dizziness and lightheadedness, hyperhidrosis, sleep disturbances despite Ambien.   More nonspecific complaints including fatigue, brain fog, anxiety with worsening depression and passive death wish  She has shower intolerance, heat intolerance, and sufficient orthostatic intolerance that standing can be difficult.  Exercise tolerance manifested by dyspnea, palpitations and lightheadedness provoked by 3-5 stairs, 100 to 200 feet.  She was recently started on low-dose metoprolol with some improvement.  She takes a rare salt tablet that she has not noticed an improvement with.  Does have issues with hand swelling.  History of breast cancer for which she underwent lumpectomy and radiation therapy.  Symptoms worsened after this.  Had COVID 2021 with significant symptoms.  These other symptoms worsened after that.  Autoimmune issues with Hashimoto's and psoriasis.  Metanephrines cortisol and ACTH, TSH were normal   Diet is salt deplete and fluid deplete.  Does not use alcohol or recreational drugs DATE TEST EF   5/24 (CE)  Echo   60-65 % E/E' 8.5        Date Cr K Hgb  3/24 1.0 4.3 12.5            Past Medical History:  Diagnosis Date   Asthma    Gastritis    GERD (gastroesophageal reflux disease)    Migraine    Personal history of radiation therapy        Surgical History:  Past Surgical History:  Procedure Laterality Date   BREAST LUMPECTOMY Left    2020   CESAREAN SECTION     PARTIAL THYMECTOMY     THYROIDECTOMY       Home Meds: Current Meds  Medication Sig   albuterol (PROVENTIL HFA;VENTOLIN HFA) 108 (90 Base) MCG/ACT inhaler TAKE 2 PUFFS EVERY 4 HOURS AS NEEDED FOR COUGH OR WHEEZING   Clobetasol Propionate 0.05 % shampoo Apply 1 Application topically 3 (three) times a week.   eszopiclone (LUNESTA) 2 MG TABS tablet Take by mouth.   promethazine (PHENERGAN) 12.5 MG tablet Take 12.5 mg by mouth every 6 (six) hours as needed for nausea or vomiting.   sucralfate (CARAFATE) 1 g tablet    SUMAtriptan (IMITREX) 100 MG tablet    tretinoin (RETIN-A) 0.05 % cream SMARTSIG:1 Sparingly Topical Every Night   Vitamin D, Ergocalciferol, (DRISDOL) 1.25 MG (50000 UNIT) CAPS capsule Take by mouth.   [DISCONTINUED] omeprazole (PRILOSEC) 20 MG capsule Take 20 mg by mouth 2 (two) times daily.   [DISCONTINUED] ondansetron (ZOFRAN) 8 MG tablet Take 1 tablet (8 mg total) by mouth every 8 (eight) hours as needed for nausea or vomiting.   [DISCONTINUED] zolpidem (AMBIEN) 10 MG tablet     Allergies:  Allergies  Allergen Reactions   Codeine     Other reaction(s): Vomiting   Latex     Other reaction(s): Other (See Comments) Rash  Rash  Social History   Socioeconomic History   Marital status: Married    Spouse name: Not on file   Number of children: Not on file   Years of education: Not on file   Highest education level: Not on file  Occupational History   Not on file  Tobacco Use   Smoking status: Never   Smokeless tobacco: Not on file  Substance and Sexual Activity   Alcohol use: No   Drug use: Not on file   Sexual activity: Not on file  Other Topics Concern   Not on file  Social History Narrative   Not on file   Social Determinants of Health   Financial Resource Strain: Low Risk  (05/04/2022)   Received from [facility], [facility]   Overall Financial Resource Strain (CARDIA)    Difficulty of Paying Living Expenses: Not hard at all  Food Insecurity: No Food Insecurity (05/04/2022)   [facility], [facility]   Hunger Vital Sign    Worried About Running Out of Food in the Last Year: Never true    Ran Out of Food in the Last Year: Never true  Transportation Needs: No Transportation Needs (05/04/2022)   Received from [facility], [facility]   [facility] - Transportation    In the past 12 months, has lack of transportation kept you from medical appointments or from getting medications?: No    Lack of Transportation (Non-Medical): No  Physical Activity: Not on file  Stress: Not on file  Social Connections: Not on file  Intimate Partner Violence: Not on file     Family History  Problem Relation Age of Onset   Bladder Cancer Maternal Grandmother      ROS:  Please see the history of present illness.     All other systems reviewed and negative.    Physical Exam: Blood pressure 106/78, pulse 73, height 5\' 3"  (1.6 m), weight 259 lb (117.5 kg), SpO2 97%. General: Well developed, obese female in no acute distress. Head: Normocephalic, atraumatic, sclera non-icteric, no xanthomas, nares are without discharge. EENT: normal  Lymph Nodes:  none Neck: Negative for carotid bruits. JVD not elevated. Back:without scoliosis kyphosis Lungs: Clear bilaterally to auscultation without wheezes, rales, or rhonchi. Breathing is unlabored. Heart: RRR with S1 S2. No  murmur . No rubs, or gallops appreciated. Abdomen: Soft, non-tender, non-distended with normoactive bowel sounds. No hepatomegaly. No rebound/guarding. No obvious abdominal masses. Msk:  Strength and tone appear normal for age. Extremities: No clubbing or cyanosis. No + edema.  Distal pedal pulses are 2+ and equal bilaterally. Skin: Warm and  Dry Neuro: Alert and oriented X 3. CN III-XII intact Grossly normal sensory and motor function . Psych:  Responds to questions appropriately with a normal affect.        EKG: Sinus at 73 Interval 15/07/38   Assessment and Plan:  POTS-probable  Prominent GI symptoms  Hyperhidrosis  Breast cancer status post lobectomy and radiation therapy   The patient has symptoms consistent with POTS all be with a heart rate excursion that does not quite meet criteria however this was done in the context of limited standing i.e. only 3 minutes and not 10 and in the presence of beta-blockers. Potential contributors to her POTS syndrome include her autoimmune diseases, her post COVID situation and her history of breast cancer each of which have been associate with aggravation of her symptoms which first  manifested following the birth of her first child a common trigger.  We discussed extensively the issues of dysautonomia, the physiology of orthstasis and positional stress.  We discussed the role of salt and water repletion, the importance of exercise, often needing to be started in the recumbent position, and the awareness of triggers and the role of ambient heat and dehydration  Discussed salt substitutes with a  goal of 7-10 gms of Sodium or 12-15 gm of sodium Chloride daily.  Rehydration solutions include Liquid IV, NUUN, TriOral, Normralyte pedialyte advanced care and Banana Bags.  Salt tablets include plain salt tablets, SaltStick Vitassium, thermatabs amongst others.   The higher sodium concentration per serving on this list Within You hydration formula also at 500 mg normalyte about 800 mg of sodium per serving [id number] mg     Salt supplements are best used with adjunctive sugar  Also discussed the role of compression wear including thigh sleeves and abdominal binders.  Calf compression is not specifically recommended..  Further addressed recent head of the bed 6 inches, and the importance of  not allowing her self to be recumbent apart from her sleep time.  Encouraged her to shower at night.  Offered her a handicap sticker.  Stressed the importance of mental health and hygiene as well as importance of exercise and gave her the name of the [facility] protocol with the warning that it could take weeks and months before she begins to feel better  With her GI symptoms, it raises the possibility as to whether she could have   an autoimmune gastrointestinal dysfunction and I taken the liberty of ordering antiacetylcholine receptor blocking antibodies.  Will also refer her to Dr. Dominic Pea at [facility] who has expertise in dysautonomia as well as gluten enteropathy elimination of which has been associate with some improvement in her GI symptoms.  82  min was spent in care of the patient including the review of records     Will consider the use of glycopyrrolate in the future for hyperhidrosis but with the aforementioned interventions would like to hold off      Sherryl Manges

## 2022-12-21 LAB — ACETYLCHOLINE RECEPTOR, BLOCKING: Acetylchol Block Ab: 19 % (ref 0–25)

## 2022-12-28 ENCOUNTER — Other Ambulatory Visit: Payer: Self-pay | Admitting: Internal Medicine

## 2022-12-28 DIAGNOSIS — R112 Nausea with vomiting, unspecified: Secondary | ICD-10-CM

## 2023-03-22 ENCOUNTER — Ambulatory Visit: Payer: No Typology Code available for payment source | Attending: Internal Medicine | Admitting: Internal Medicine

## 2023-03-22 ENCOUNTER — Encounter: Payer: Self-pay | Admitting: Internal Medicine

## 2023-03-22 VITALS — Ht 63.0 in | Wt 260.0 lb

## 2023-03-22 DIAGNOSIS — Z853 Personal history of malignant neoplasm of breast: Secondary | ICD-10-CM | POA: Diagnosis not present

## 2023-03-22 DIAGNOSIS — R61 Generalized hyperhidrosis: Secondary | ICD-10-CM | POA: Diagnosis not present

## 2023-03-22 DIAGNOSIS — R112 Nausea with vomiting, unspecified: Secondary | ICD-10-CM | POA: Diagnosis not present

## 2023-03-22 DIAGNOSIS — R42 Dizziness and giddiness: Secondary | ICD-10-CM | POA: Diagnosis not present

## 2023-03-22 DIAGNOSIS — R198 Other specified symptoms and signs involving the digestive system and abdomen: Secondary | ICD-10-CM | POA: Diagnosis not present

## 2023-03-22 NOTE — Progress Notes (Signed)
 Electrophysiology TeleHealth Note      Date:  03/22/2023   ID:  Melissa Williamson, Melissa Williamson 1975-08-20, MRN [id number]  Location: patient's home  Provider location: [address], [place]  Evaluation Performed: Follow-up visit  PCP:  Melissa Ophelia JINNY DOUGLAS, MD  Cardiologist:     Electrophysiologist:  SK   Chief Complaint:  pots  History of Present Illness:   My location is [facility]. The Patients location is their car. Melissa Williamson is a 48 y.o. female who presents via audio conferencing for a telehealth visit today.  Since last being seen in our clinic for POTS with GI symptoms and hyperhidrosis the patient reports less dizziness.  She attributes this to increased salt and water intake.  Salt intake is relatively modest averaging 750 mg of excess sodium per day and 40 ounces of water.  Has tried compression but has struggled with the heat aspects of it.  Still with hyperhidrosis.  NAUSEA EARLY satiety with vomiting  Often provoked by standing     The patient denies symptoms of fevers, chills, cough, or new SOB worrisome for COVID 19.   Past Medical History:  Diagnosis Date   Asthma    Gastritis    GERD (gastroesophageal reflux disease)    Migraine    Personal history of radiation therapy     Past Surgical History:  Procedure Laterality Date   BREAST LUMPECTOMY Left    2020   CESAREAN SECTION     PARTIAL THYMECTOMY     THYROIDECTOMY      Current Outpatient Medications  Medication Sig Dispense Refill   albuterol (PROVENTIL HFA;VENTOLIN HFA) 108 (90 Base) MCG/ACT inhaler TAKE 2 PUFFS EVERY 4 HOURS AS NEEDED FOR COUGH OR WHEEZING     buPROPion (WELLBUTRIN XL) 150 MG 24 hr tablet Take 150 mg by mouth daily.     clobetasol (TEMOVATE) 0.05 % external solution Apply 1 Application topically 2 (two) times daily.     Clobetasol Propionate 0.05 % shampoo Apply 1 Application topically 3 (three) times a week.     DULoxetine (CYMBALTA) 60 MG capsule Take  60 mg by mouth daily.     eszopiclone (LUNESTA) 2 MG TABS tablet Take by mouth.     ipratropium-albuterol (DUONEB) 0.5-2.5 (3) MG/3ML SOLN TAKE 3 MLS BY NEBULIZATION 4 (FOUR) TIMES DAILY     ketoconazole (NIZORAL) 2 % shampoo APPLY AND WASH SCALP TOPICAL 2(TWO) TIMES A WEEK. LETTING IT SIT 10-15 MINUTES BEFORE RINSING OFF.     metoprolol tartrate (LOPRESSOR) 25 MG tablet Take 25 mg by mouth 2 (two) times daily.     omeprazole (PRILOSEC) 40 MG capsule Take 40 mg by mouth 2 (two) times daily.     ondansetron  (ZOFRAN -ODT) 4 MG disintegrating tablet Take 4 mg by mouth every 8 (eight) hours as needed.     prochlorperazine (COMPAZINE) 10 MG tablet Take by mouth.     promethazine (PHENERGAN) 12.5 MG tablet Take 12.5 mg by mouth every 6 (six) hours as needed for nausea or vomiting.     sucralfate (CARAFATE) 1 g tablet      SUMAtriptan (IMITREX) 100 MG tablet      tretinoin (RETIN-A) 0.05 % cream SMARTSIG:1 Sparingly Topical Every Night     Vitamin D, Ergocalciferol, (DRISDOL) 1.25 MG (50000 UNIT) CAPS capsule Take by mouth.     zolpidem (AMBIEN) 5 MG tablet Take 5 mg by mouth at bedtime as needed.     trimethoprim  (  TRIMPEX ) 100 MG tablet Take 1 tablet (100 mg total) by mouth daily. (Patient not taking: Reported on 03/22/2023) 90 tablet 3   No current facility-administered medications for this visit.    Allergies:   Codeine and Latex   ROS:  Please see the history of present illness.   All other systems are personally reviewed and negative.    Exam:    Vital Signs:  Ht 5' 3 (1.6 m)   Wt 260 lb (117.9 kg)   BMI 46.06 kg/m      Labs/Other Tests and Data Reviewed:    Recent Labs: No results found for requested labs within last 365 days.   Wt Readings from Last 3 Encounters:  03/22/23 260 lb (117.9 kg)  12/14/22 259 lb (117.5 kg)  02/10/19 260 lb (117.9 kg)     Other studies personally reviewed: Additional studies/ records that were reviewed today include:      ASSESSMENT & PLAN:     POTS-probable   Prominent GI symptoms   Hyperhidrosis   Breast cancer status post lobectomy and radiation therapy  Referral to [facility] Melissa Williamson fax #[phone] Increase salt and water to 3 L a day and 2 g of sodium at least May need a renewal of her handicap sticker Thigh compression probably using thigh sleeves to avoid the heat Reach out to her PCP regarding sleep and be intentional about sleep hygiene    Follow-up: 25m    Current medicines are reviewed at length with the patient today.   The patient does not have concerns regarding her medicines.  The following changes were made today:  (As above)   Labs/ tests ordered today include:  No orders of the defined types were placed in this encounter.     Today, I have spent 21 minutes with the patient with telehealth technology discussing the above.  Signed, Elspeth Sage, MD  03/22/2023 1:41 PM     [facility] [address] [place] [address] [phone] (office) [phone] (fax)

## 2023-03-22 NOTE — Patient Instructions (Signed)
 Medication Instructions:  The current medical regimen is effective;  continue present plan and medications.  *If you need a refill on your cardiac medications before your next appointment, please call your pharmacy*   Follow-Up: At [facility], you and your health needs are our priority.  As part of our continuing mission to provide you with exceptional heart care, we have created designated Provider Care Teams.  These Care Teams include your primary Cardiologist (physician) and Advanced Practice Providers (APPs -  Physician Assistants and Nurse Practitioners) who all work together to provide you with the care you need, when you need it.  We recommend signing up for the patient portal called MyChart.  Sign up information is provided on this After Visit Summary.  MyChart is used to connect with patients for Virtual Visits (Telemedicine).  Patients are able to view lab/test results, encounter notes, upcoming appointments, etc.  Non-urgent messages can be sent to your provider as well.   To learn more about what you can do with MyChart, go to [email or web address].    Your next appointment:   4 month(s)  Provider:   Elspeth Sage, MD    Other Instructions Referral to [facility]- they will contact you for an appointment.

## 2023-04-12 ENCOUNTER — Encounter: Payer: Self-pay | Admitting: Internal Medicine

## 2023-08-06 ENCOUNTER — Encounter: Payer: Self-pay | Admitting: Internal Medicine

## 2023-08-31 ENCOUNTER — Ambulatory Visit: Attending: Cardiology | Admitting: Cardiology

## 2023-08-31 ENCOUNTER — Encounter: Payer: Self-pay | Admitting: Cardiology

## 2023-08-31 VITALS — BP 116/80 | HR 97 | Ht 63.0 in | Wt 298.2 lb

## 2023-08-31 DIAGNOSIS — I951 Orthostatic hypotension: Secondary | ICD-10-CM

## 2023-08-31 NOTE — Progress Notes (Signed)
 Cardiology Office Note:    Date:  08/31/2023   ID:  Melissa Williamson, DOB [birth year], MRN [id number]  PCP:  Melissa Ophelia JINNY DOUGLAS, MD   [facility] Providers Cardiologist:  None     Referring MD: Melissa Ophelia JINNY DOUGLAS, MD   Chief Complaint  Patient presents with   Follow-up    Patient request to re-due handicap sticker. Patient states that Dr. Fernande was supposed to refer the patient to a GI physician, but has not heard anything. Patient would like to discuss different medication option due to her POTS. Patient states that she experiences nausea constantly, sweating , and her hand numbness has gotten worse. Meds reviewed.     History of Present Illness:    Melissa Williamson is a 48 y.o. female with a hx of probable POTS, breast cancer s/p lobectomy plus RT 2020, migraine, who presents for follow-up.  Previously seen by Dr. Fernande, due to symptoms of probable POTS/autonomic intolerance.  Has a documented history of GI symptoms including nausea, constipation.  Also has photophobia, fatigue, dizziness and lightheadedness, hyperhidrosis.  hydration, salt intake recommended after last visit.  She has palpitations and lightheadedness after walking 100 to 200 feet, climbing 3-5 stairs.  Takes Lopressor 25 mg twice daily.  Previously referred to GI due to ongoing abdominal symptoms of nausea, constipation.  Has not been called for appointment yet with GI specialist at [facility].  Echo 06/2022 EF>55%  Past Medical History:  Diagnosis Date   Asthma    Gastritis    GERD (gastroesophageal reflux disease)    Migraine    Personal history of radiation therapy     Past Surgical History:  Procedure Laterality Date   BREAST LUMPECTOMY Left    2020   CESAREAN SECTION     PARTIAL THYMECTOMY     THYROIDECTOMY      Current Medications: Current Meds  Medication Sig   albuterol (PROVENTIL HFA;VENTOLIN HFA) 108 (90 Base) MCG/ACT inhaler TAKE 2 PUFFS EVERY 4 HOURS AS NEEDED FOR COUGH OR WHEEZING    buPROPion (WELLBUTRIN XL) 150 MG 24 hr tablet Take 150 mg by mouth daily.   clobetasol (TEMOVATE) 0.05 % external solution Apply 1 Application topically 2 (two) times daily.   Clobetasol Propionate 0.05 % shampoo Apply 1 Application topically 3 (three) times a week.   DULoxetine (CYMBALTA) 60 MG capsule Take 60 mg by mouth daily.   eszopiclone (LUNESTA) 2 MG TABS tablet Take by mouth.   ipratropium-albuterol (DUONEB) 0.5-2.5 (3) MG/3ML SOLN TAKE 3 MLS BY NEBULIZATION 4 (FOUR) TIMES DAILY   ketoconazole (NIZORAL) 2 % shampoo APPLY AND WASH SCALP TOPICAL 2(TWO) TIMES A WEEK. LETTING IT SIT 10-15 MINUTES BEFORE RINSING OFF.   metoprolol tartrate (LOPRESSOR) 25 MG tablet Take 25 mg by mouth 2 (two) times daily.   omeprazole (PRILOSEC) 40 MG capsule Take 40 mg by mouth 2 (two) times daily.   ondansetron  (ZOFRAN -ODT) 4 MG disintegrating tablet Take 4 mg by mouth every 8 (eight) hours as needed.   prochlorperazine (COMPAZINE) 10 MG tablet Take by mouth.   promethazine (PHENERGAN) 12.5 MG tablet Take 12.5 mg by mouth every 6 (six) hours as needed for nausea or vomiting.   sucralfate (CARAFATE) 1 g tablet    SUMAtriptan (IMITREX) 100 MG tablet    tretinoin (RETIN-A) 0.05 % cream SMARTSIG:1 Sparingly Topical Every Night   Vitamin D, Ergocalciferol, (DRISDOL) 1.25 MG (50000 UNIT) CAPS capsule Take by mouth.   zolpidem (AMBIEN) 5 MG tablet Take  5 mg by mouth at bedtime as needed.     Allergies:   Codeine and Latex   Social History   Socioeconomic History   Marital status: Married    Spouse name: Not on file   Number of children: Not on file   Years of education: Not on file   Highest education level: Not on file  Occupational History   Not on file  Tobacco Use   Smoking status: Never   Smokeless tobacco: Not on file  Substance and Sexual Activity   Alcohol use: No   Drug use: Not on file   Sexual activity: Not on file  Other Topics Concern   Not on file  Social History Narrative   Not on  file   Social Drivers of Health   Financial Resource Strain: Low Risk  (12/26/2022)   Received from [facility]   Overall Financial Resource Strain (CARDIA)    Difficulty of Paying Living Expenses: Not hard at all  Food Insecurity: No Food Insecurity (12/26/2022)   Received from [facility]   Hunger Vital Sign    Within the past 12 months, you worried that your food would run out before you got the money to buy more.: Never true    Within the past 12 months, the food you bought just didn't last and you didn't have money to get more.: Never true  Transportation Needs: No Transportation Needs (12/26/2022)   Received from [facility] - Transportation    In the past 12 months, has lack of transportation kept you from medical appointments or from getting medications?: No    Lack of Transportation (Non-Medical): No  Physical Activity: Not on file  Stress: Not on file  Social Connections: Not on file     Family History: The patient's family history includes Bladder Cancer in her maternal grandmother.  ROS:   Please see the history of present illness.     All other systems reviewed and are negative.  EKGs/Labs/Other Studies Reviewed:    The following studies were reviewed today:  EKG Interpretation Date/Time:  Friday August 31 2023 15:01:08 EDT Ventricular Rate:  97 PR Interval:  130 QRS Duration:  74 QT Interval:  344 QTC Calculation: 436 R Axis:   31  Text Interpretation: Normal sinus rhythm Nonspecific ST and T wave abnormality Confirmed by Darliss Rogue ([id number]) on 08/31/2023 3:03:01 PM    Recent Labs: No results found for requested labs within last 365 days.  Recent Lipid Panel No results found for: CHOL, TRIG, HDL, CHOLHDL, VLDL, LDLCALC, LDLDIRECT   Risk Assessment/Calculations:             Physical Exam:    VS:  BP 116/80   Pulse 97   Ht 5' 3 (1.6 m)   Wt 298 lb 3.2 oz (135.3  kg)   SpO2 96%   BMI 52.82 kg/m     Wt Readings from Last 3 Encounters:  08/31/23 298 lb 3.2 oz (135.3 kg)  03/22/23 260 lb (117.9 kg)  12/14/22 259 lb (117.5 kg)     GEN:  Well nourished, well developed in no acute distress HEENT: Normal NECK: No JVD; No carotid bruits CARDIAC: RRR, no murmurs, rubs, gallops RESPIRATORY:  Clear to auscultation without rales, wheezing or rhonchi  ABDOMEN: Soft, non-tender, non-distended MUSCULOSKELETAL:  No edema; No deformity  SKIN: Warm and dry NEUROLOGIC:  Alert and oriented x 3 PSYCHIATRIC:  Normal affect   ASSESSMENT:  1. Orthostatic intolerance   2. Morbid obesity (HCC)    PLAN:    In order of problems listed above:  Orthostatic intolerance, symptoms suggest POTS.  Educated at length on hydration, benefit of graduated exercises.  OTC salt tablets okay.  Several GI symptoms, previously referred to [facility], we will again place referral.  Morbid obesity, low-calorie diet, weight loss, increase activity advised.  Will refer to healthy living and weight loss clinic.     Follow-up in 3 months   Medication Adjustments/Labs and Tests Ordered: Current medicines are reviewed at length with the patient today.  Concerns regarding medicines are outlined above.  Orders Placed This Encounter  Procedures   Ambulatory referral to Gastroenterology   Amb Ref to Medical Weight Management   EKG 12-Lead   No orders of the defined types were placed in this encounter.   Patient Instructions  Medication Instructions:  Your physician recommends that you continue on your current medications as directed. Please refer to the Current Medication list given to you today.   *If you need a refill on your cardiac medications before your next appointment, please call your pharmacy*  Lab Work: No labs ordered today  If you have labs (blood work) drawn today and your tests are completely normal, you will receive your results only by: MyChart Message (if  you have MyChart) OR A paper copy in the mail If you have any lab test that is abnormal or we need to change your treatment, we will call you to review the results.  Testing/Procedures: No test ordered today   Follow-Up: At [facility], you and your health needs are our priority.  As part of our continuing mission to provide you with exceptional heart care, our providers are all part of one team.  This team includes your primary Cardiologist (physician) and Advanced Practice Providers or APPs (Physician Assistants and Nurse Practitioners) who all work together to provide you with the care you need, when you need it.  Your next appointment:   3 month(s)  Provider:   You may see Dr. Darliss or one of the following Advanced Practice Providers on your designated Care Team:   Lonni Meager, NP Lesley Maffucci, PA-C Bernardino Bring, PA-C Cadence [place], PA-C Tylene Lunch, NP Barnie Hila, NP    We recommend signing up for the patient portal called MyChart.  Sign up information is provided on this After Visit Summary.  MyChart is used to connect with patients for Virtual Visits (Telemedicine).  Patients are able to view lab/test results, encounter notes, upcoming appointments, etc.  Non-urgent messages can be sent to your provider as well.   To learn more about what you can do with MyChart, go to [email or web address][place] [address] [place] [address] [phone]     Signed, Redell Darliss, MD  08/31/2023 4:16 PM     [facility]

## 2023-08-31 NOTE — Patient Instructions (Addendum)
 Medication Instructions:  Your physician recommends that you continue on your current medications as directed. Please refer to the Current Medication list given to you today.   *If you need a refill on your cardiac medications before your next appointment, please call your pharmacy*  Lab Work: No labs ordered today  If you have labs (blood work) drawn today and your tests are completely normal, you will receive your results only by: MyChart Message (if you have MyChart) OR A paper copy in the mail If you have any lab test that is abnormal or we need to change your treatment, we will call you to review the results.  Testing/Procedures: No test ordered today   Follow-Up: At [facility], you and your health needs are our priority.  As part of our continuing mission to provide you with exceptional heart care, our providers are all part of one team.  This team includes your primary Cardiologist (physician) and Advanced Practice Providers or APPs (Physician Assistants and Nurse Practitioners) who all work together to provide you with the care you need, when you need it.  Your next appointment:   3 month(s)  Provider:   You may see Dr. Darliss or one of the following Advanced Practice Providers on your designated Care Team:   Lonni Meager, NP Lesley Maffucci, PA-C Bernardino Bring, PA-C Cadence [place], PA-C Tylene Lunch, NP Barnie Hila, NP    We recommend signing up for the patient portal called MyChart.  Sign up information is provided on this After Visit Summary.  MyChart is used to connect with patients for Virtual Visits (Telemedicine).  Patients are able to view lab/test results, encounter notes, upcoming appointments, etc.  Non-urgent messages can be sent to your provider as well.   To learn more about what you can do with MyChart, go to [email or web address].    [place] [address] [place] [address] [phone]

## 2023-09-03 ENCOUNTER — Encounter (INDEPENDENT_AMBULATORY_CARE_PROVIDER_SITE_OTHER): Payer: Self-pay

## 2023-12-03 ENCOUNTER — Other Ambulatory Visit: Payer: Self-pay | Admitting: Internal Medicine

## 2023-12-03 DIAGNOSIS — Z1231 Encounter for screening mammogram for malignant neoplasm of breast: Secondary | ICD-10-CM

## 2023-12-07 ENCOUNTER — Ambulatory Visit: Attending: Cardiology | Admitting: Cardiology

## 2023-12-10 ENCOUNTER — Encounter: Payer: Self-pay | Admitting: Cardiology

## 2024-01-03 ENCOUNTER — Ambulatory Visit
Admission: RE | Admit: 2024-01-03 | Discharge: 2024-01-03 | Disposition: A | Discharge status: Home / Self Care | Source: Ambulatory Visit | Attending: Internal Medicine | Admitting: Internal Medicine

## 2024-01-03 DIAGNOSIS — Z1231 Encounter for screening mammogram for malignant neoplasm of breast: Secondary | ICD-10-CM | POA: Diagnosis present

## 2024-03-05 ENCOUNTER — Ambulatory Visit: Admitting: Cardiology

## 2024-03-06 ENCOUNTER — Other Ambulatory Visit: Payer: Self-pay | Admitting: Unknown Physician Specialty

## 2024-03-06 DIAGNOSIS — R221 Localized swelling, mass and lump, neck: Secondary | ICD-10-CM

## 2024-03-21 ENCOUNTER — Ambulatory Visit: Admission: RE | Admit: 2024-03-21 | Source: Ambulatory Visit

## 2024-03-21 ENCOUNTER — Other Ambulatory Visit

## 2024-03-21 DIAGNOSIS — R221 Localized swelling, mass and lump, neck: Secondary | ICD-10-CM

## 2024-03-21 MED ORDER — IOHEXOL 300 MG/ML  SOLN
75.0000 mL | Freq: Once | INTRAMUSCULAR | Status: AC | PRN
  Administered 2024-03-21: 75 mL via INTRAVENOUS

## 2024-03-21 MED ORDER — SODIUM CHLORIDE 0.9 % IV SOLN: INTRAVENOUS | Status: AC
